# Patient Record
Sex: Male | Born: 1935 | Race: White | Hispanic: No | Marital: Married | Smoking: Never smoker
Health system: Southern US, Community
[De-identification: ages and names within clinical notes are randomized; demographics above are authoritative.]

## PROBLEM LIST (undated history)

## (undated) DIAGNOSIS — C61 Malignant neoplasm of prostate: Secondary | ICD-10-CM

## (undated) DIAGNOSIS — L729 Follicular cyst of the skin and subcutaneous tissue, unspecified: Secondary | ICD-10-CM

## (undated) DIAGNOSIS — K759 Inflammatory liver disease, unspecified: Secondary | ICD-10-CM

## (undated) DIAGNOSIS — G5603 Carpal tunnel syndrome, bilateral upper limbs: Secondary | ICD-10-CM

## (undated) DIAGNOSIS — K219 Gastro-esophageal reflux disease without esophagitis: Secondary | ICD-10-CM

## (undated) HISTORY — PX: PROSTATE SURGERY: SHX751

## (undated) HISTORY — PX: CYST REMOVAL LEG: SHX6280

## (undated) HISTORY — DX: Malignant neoplasm of prostate: C61

---
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
## 2008-05-07 ENCOUNTER — Ambulatory Visit: Payer: Self-pay | Admitting: Diagnostic Radiology

## 2008-05-07 ENCOUNTER — Ambulatory Visit (HOSPITAL_BASED_OUTPATIENT_CLINIC_OR_DEPARTMENT_OTHER): Admission: RE | Admit: 2008-05-07 | Discharge: 2008-05-07 | Payer: Self-pay | Admitting: Family Medicine

## 2009-11-10 IMAGING — CR DG HIP (WITH OR WITHOUT PELVIS) 2-3V*L*
3 series · 3 of 3 positions shown · non-contrast
Comparison: None

CLINICAL DATA: Left hip pain.  No injury.

LEFT HIP - COMPLETE 2+ VIEW

[t pelvis a.p.]
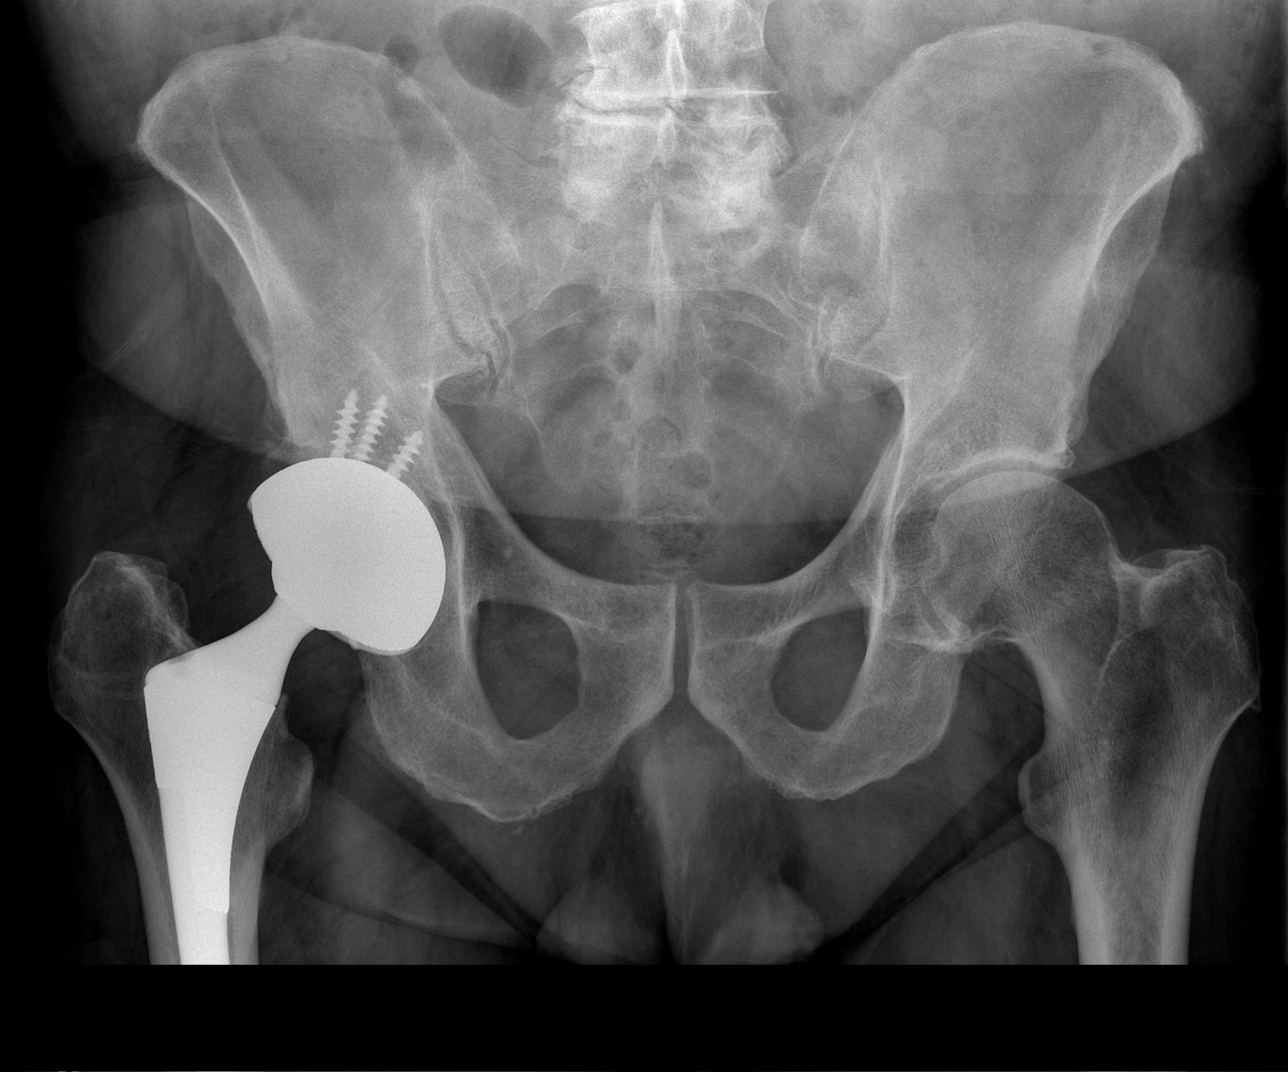

[t hip ap left]
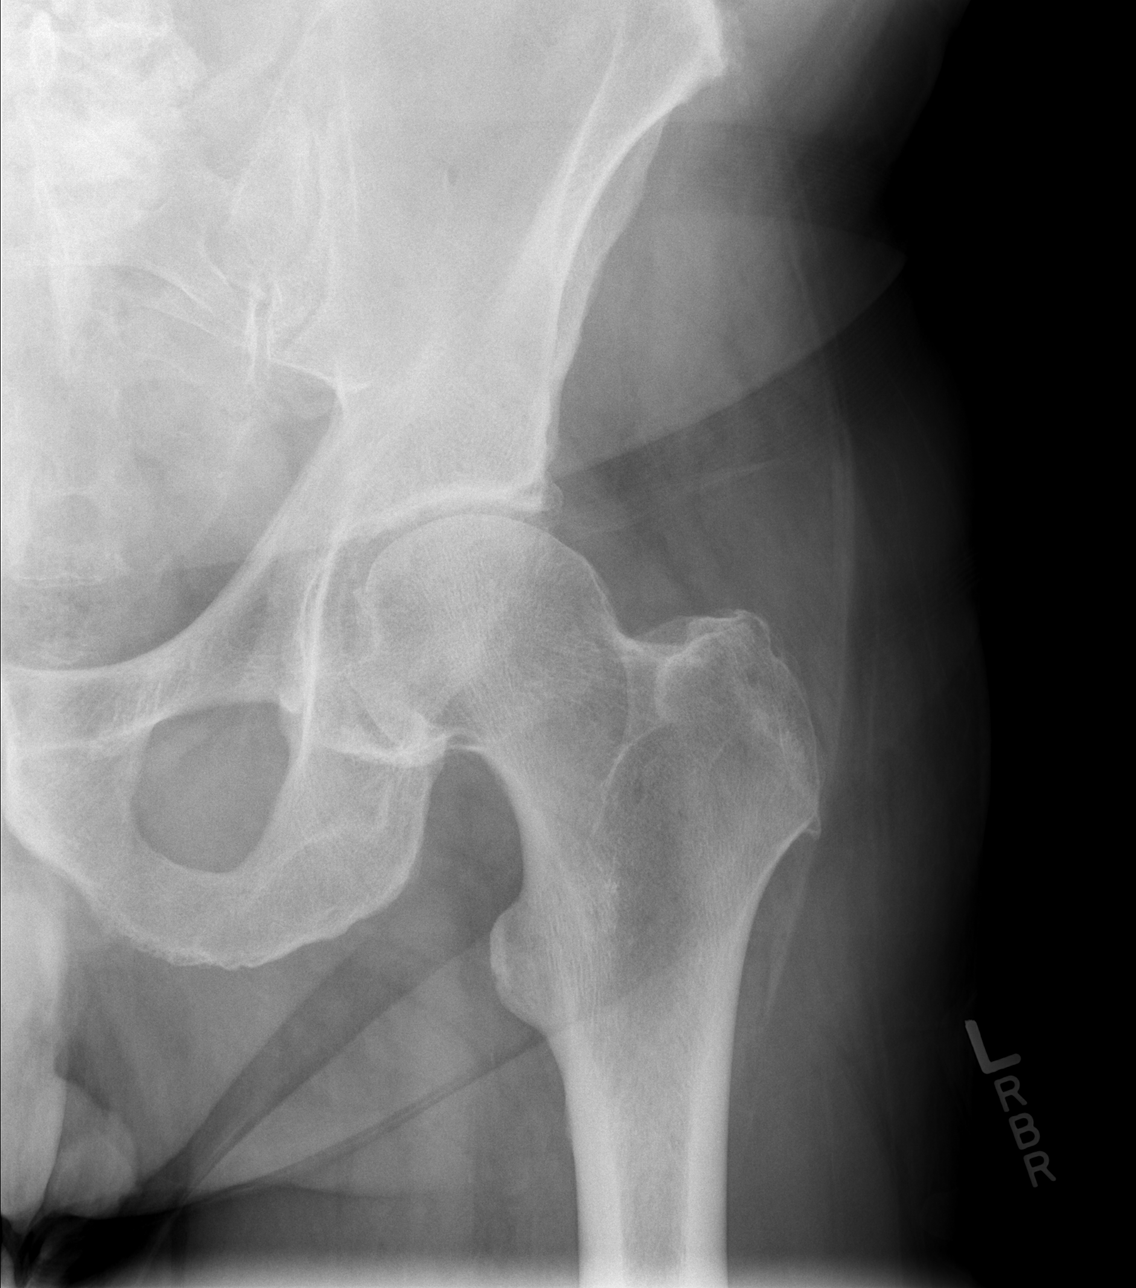

[t hip frog leg left]
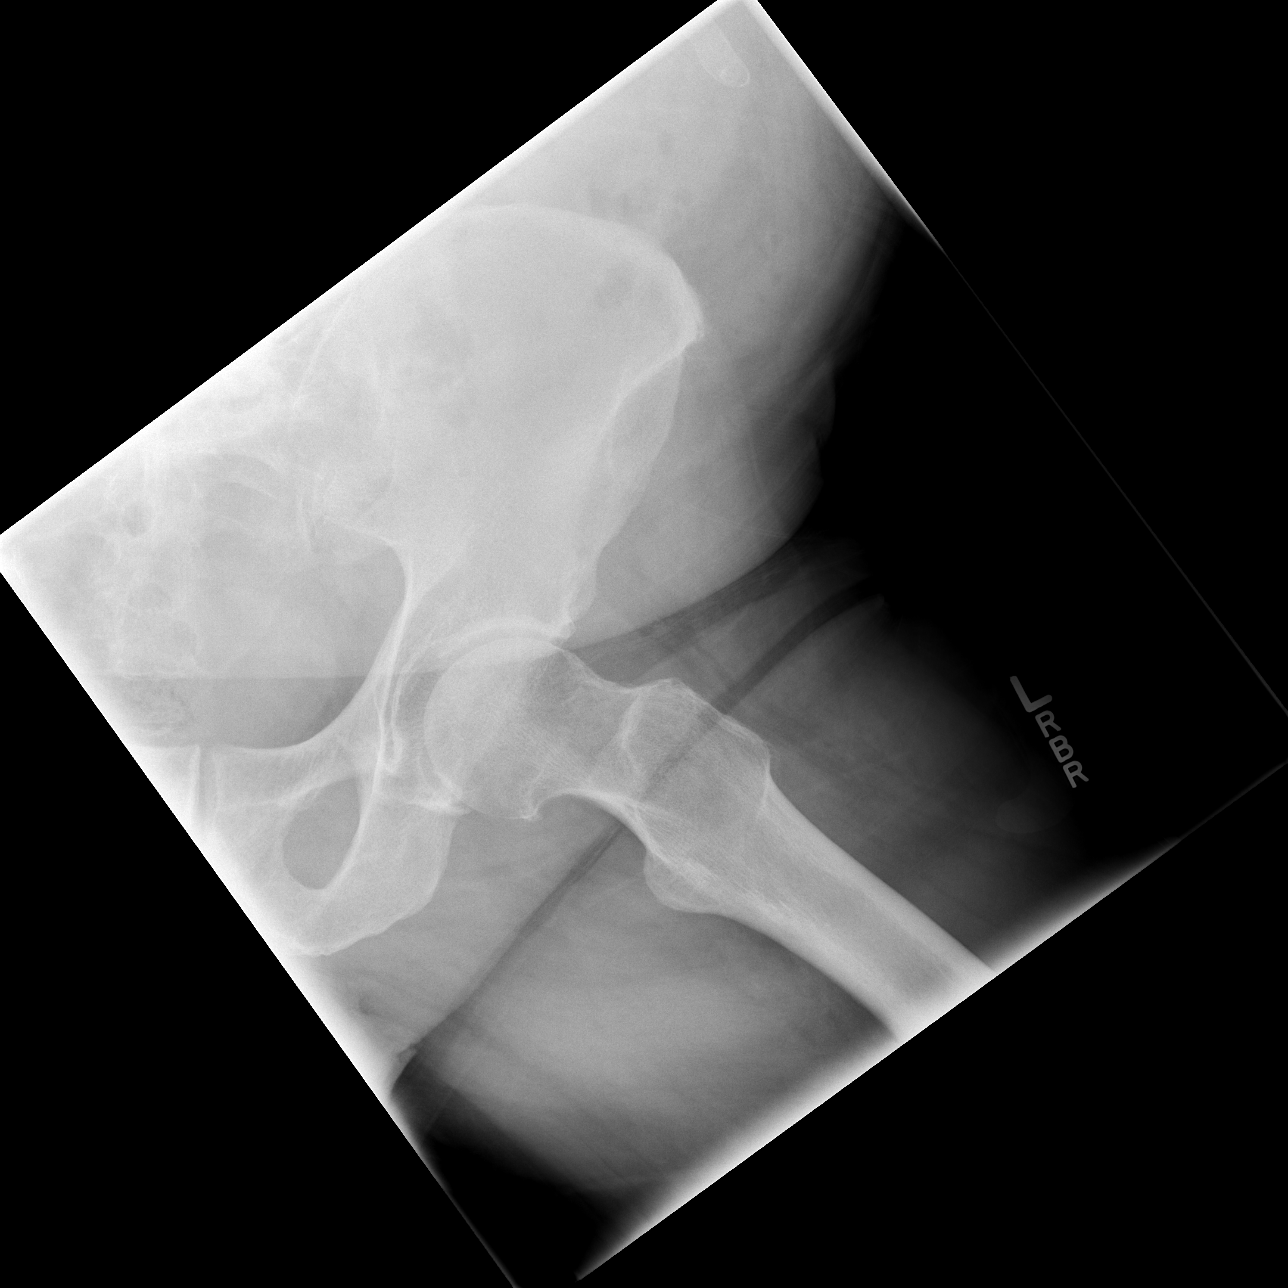

[3 of 3 positions shown; findings below may reference images not displayed]

FINDINGS: The patient status post right total hip replacement.  The
mild degenerative changes in the left hip. No acute bony
abnormality.  Specifically, no fracture, subluxation, or
dislocation.  Soft tissues are intact. SI joints are symmetric
IMPRESSION: Mild degenerative changes in the left hip.  No acute findings.

Prior right hip replacement.

## 2016-09-18 DIAGNOSIS — G5603 Carpal tunnel syndrome, bilateral upper limbs: Secondary | ICD-10-CM | POA: Diagnosis not present

## 2016-10-04 DIAGNOSIS — Z961 Presence of intraocular lens: Secondary | ICD-10-CM | POA: Diagnosis not present

## 2016-10-04 DIAGNOSIS — H524 Presbyopia: Secondary | ICD-10-CM | POA: Diagnosis not present

## 2016-10-04 DIAGNOSIS — H43393 Other vitreous opacities, bilateral: Secondary | ICD-10-CM | POA: Diagnosis not present

## 2016-10-04 DIAGNOSIS — H5203 Hypermetropia, bilateral: Secondary | ICD-10-CM | POA: Diagnosis not present

## 2016-10-04 DIAGNOSIS — H52203 Unspecified astigmatism, bilateral: Secondary | ICD-10-CM | POA: Diagnosis not present

## 2016-12-25 DIAGNOSIS — N401 Enlarged prostate with lower urinary tract symptoms: Secondary | ICD-10-CM | POA: Diagnosis not present

## 2016-12-25 DIAGNOSIS — Z0000 Encounter for general adult medical examination without abnormal findings: Secondary | ICD-10-CM | POA: Diagnosis not present

## 2016-12-25 DIAGNOSIS — K219 Gastro-esophageal reflux disease without esophagitis: Secondary | ICD-10-CM | POA: Diagnosis not present

## 2016-12-25 DIAGNOSIS — M25552 Pain in left hip: Secondary | ICD-10-CM | POA: Diagnosis not present

## 2016-12-25 DIAGNOSIS — Z131 Encounter for screening for diabetes mellitus: Secondary | ICD-10-CM | POA: Diagnosis not present

## 2016-12-25 DIAGNOSIS — R2681 Unsteadiness on feet: Secondary | ICD-10-CM | POA: Diagnosis not present

## 2016-12-25 DIAGNOSIS — M1612 Unilateral primary osteoarthritis, left hip: Secondary | ICD-10-CM | POA: Diagnosis not present

## 2016-12-25 DIAGNOSIS — R94120 Abnormal auditory function study: Secondary | ICD-10-CM | POA: Diagnosis not present

## 2016-12-26 DIAGNOSIS — Z131 Encounter for screening for diabetes mellitus: Secondary | ICD-10-CM | POA: Diagnosis not present

## 2016-12-26 DIAGNOSIS — K219 Gastro-esophageal reflux disease without esophagitis: Secondary | ICD-10-CM | POA: Diagnosis not present

## 2017-01-10 DIAGNOSIS — M1612 Unilateral primary osteoarthritis, left hip: Secondary | ICD-10-CM | POA: Diagnosis not present

## 2017-01-10 DIAGNOSIS — Z96641 Presence of right artificial hip joint: Secondary | ICD-10-CM | POA: Diagnosis not present

## 2017-01-10 DIAGNOSIS — G8929 Other chronic pain: Secondary | ICD-10-CM | POA: Diagnosis not present

## 2017-04-23 ENCOUNTER — Other Ambulatory Visit: Payer: Self-pay

## 2017-04-23 NOTE — Patient Outreach (Signed)
[place] [facility]) Care Management  04/23/2017  Turki Tapanes 1921/04/03 [id number]   [employer] High risk screen- No answer. RNCM attempted contact number provided [phone] and the number listed in chart [phone]. Both numbers are not in service. RNCM called [phone]. No answer. HIPPA compliant message left.  Thea Silversmith, RN, MSN, [place] Coordinator Cell: [phone]

## 2017-04-23 NOTE — Patient Outreach (Signed)
[facility]) Care Management  04/22/2017  Andre Clark 05-25-1920 [id number]   [employer] High risk screen- RNCM dialed provided contact number [phone]-recorded message that phone was no longer in service. Unable to leave message.  Thea Silversmith, RN, MSN, [place] Coordinator Cell: [phone]

## 2017-04-29 ENCOUNTER — Other Ambulatory Visit: Payer: Self-pay

## 2017-04-29 NOTE — Patient Outreach (Signed)
[facility]) Care Management  04/29/2017  Andre Clark [birth year] [id number]    [employer] High risk screen completed.   83 year old with history of right hip surgery about 10 years ago. He denies any history of diabetes, heart failure, COPD, hypertension or any other cardiac issue.  Andre Clark reports he and his wife just moved to an independent living facility ([facility]) in [place] within the past week.   He reports he is on one medication (omeprazole) and vitamins. He denies any difficulty with obtaining or taking medications.  Andre Clark reports his primary care is Dr. Jefm Petty at [place] (part of [facility]). He states he is interested in finding another primary care closer to his current location due to the distance. RNCM discussed the concierge (number on the back of the care) may be able to help assist with providers in the area.  Andre Clark does acknowledge some discomfort of his right knee and states his legs will sometimes give way. He now uses a Electronics engineer and states this has helped steady him. Client states primary care if aware of his knee discomfort. Client states it is not bad enough for him to take any medication even over the counter medication.  He reports a fall about 4 days ago. "I was putting some heavy boxes on seat of the walker, the braken system loosend and I tried to follow it. The box dropped on the cement and I scratched my forehead. I don't like to run to the doctor". He states he cleaned up the scratch and adds "there are no signs of infection"   RNCM recommended follow up with primary care provider). Client declines at this time, stating he has taken care of everything. Also discussed outpatient rehabilitation for physical therapy due to his report of leg sometimes giving away, knee discomfort and history of recent fall. Andre Clark is agreeable to some outpatient physical therapy. He states he has  transportation and still drives.  RNCM informed client that primary care would be the one to make the referral to the outpatient rehabilitation.  Plan: RNCM will refer to telephonic care coordinator to assist client with care coordination of resources and provider resources.    Thea Silversmith, RN, MSN, [place] Coordinator Cell: [phone]

## 2017-05-03 ENCOUNTER — Other Ambulatory Visit: Payer: Self-pay

## 2017-05-03 NOTE — Patient Outreach (Signed)
[facility]) Care Management  05/03/2017  Andre Clark 10-13-1920 [id number]  Telephone assessment follow up: Telephone call to patient. Unable to reach. HIPAA compliant voice message left with call back phone number.   PLAN; RNCM will attempt 2nd telephone outreach to patient within 5 business days.   Quinn Plowman RN,BSN,CCM [facility] Telephonic  [phone]

## 2017-05-06 ENCOUNTER — Other Ambulatory Visit: Payer: Self-pay

## 2017-05-06 NOTE — Patient Outreach (Addendum)
[facility]) Care Management  05/06/2017  Andre Clark 01-11-1921 [id number]  Attempt #2 Telephone assessment follow up: Telephone call to patient. Unable to reach. HIPAA compliant voice message left with call back phone number.   PLAN; RNCM will attempt 3rd telephone outreach to patient within 10 business days.  RNCM will send outreach letter attempt contact with patient.   Quinn Plowman RN,BSN,CCM [facility] Telephonic  [phone]

## 2017-05-08 ENCOUNTER — Ambulatory Visit: Payer: Self-pay

## 2017-05-20 ENCOUNTER — Other Ambulatory Visit: Payer: Self-pay

## 2017-05-20 NOTE — Patient Outreach (Signed)
[facility]) Care Management  05/20/2017  Andre Clark Jun 10, 1920 [id number]  Telephone assessment follow up :  Attempt #3 Telephone assessment follow up: Telephone call to patient. Unable to reach. HIPAA compliant voice message left with call back phone number.   PLAN; RNCM will send outreach letter attempt contact with patient.   Quinn Plowman RN,BSN,CCM [facility] Telephonic  [phone]

## 2017-06-04 ENCOUNTER — Other Ambulatory Visit: Payer: Self-pay

## 2017-06-04 NOTE — Patient Outreach (Signed)
[facility]) Care Management  06/04/2017  Andre Clark 05-Sep-1920 [id number]   No response from patient after 3 telephone calls and outreach letter attempt.  PLAN; RNCM will refer patient to care management assistant to close patient due to being unable to reach.  RNCM will send patients primary MD notification of closure.   Quinn Plowman RN,BSN,CCM [facility] Telephonic  [phone]

## 2017-06-06 DIAGNOSIS — Z1331 Encounter for screening for depression: Secondary | ICD-10-CM | POA: Diagnosis not present

## 2017-06-06 DIAGNOSIS — Z6830 Body mass index (BMI) 30.0-30.9, adult: Secondary | ICD-10-CM | POA: Diagnosis not present

## 2017-06-06 DIAGNOSIS — K219 Gastro-esophageal reflux disease without esophagitis: Secondary | ICD-10-CM | POA: Diagnosis not present

## 2017-06-06 DIAGNOSIS — R200 Anesthesia of skin: Secondary | ICD-10-CM | POA: Diagnosis not present

## 2017-06-06 DIAGNOSIS — R202 Paresthesia of skin: Secondary | ICD-10-CM | POA: Diagnosis not present

## 2017-06-06 DIAGNOSIS — Z79899 Other long term (current) drug therapy: Secondary | ICD-10-CM | POA: Diagnosis not present

## 2017-06-06 DIAGNOSIS — Z9181 History of falling: Secondary | ICD-10-CM | POA: Diagnosis not present

## 2017-06-06 DIAGNOSIS — Z1339 Encounter for screening examination for other mental health and behavioral disorders: Secondary | ICD-10-CM | POA: Diagnosis not present

## 2017-08-01 DIAGNOSIS — J189 Pneumonia, unspecified organism: Secondary | ICD-10-CM | POA: Diagnosis not present

## 2017-08-01 DIAGNOSIS — R509 Fever, unspecified: Secondary | ICD-10-CM | POA: Diagnosis not present

## 2017-08-26 DIAGNOSIS — C44212 Basal cell carcinoma of skin of right ear and external auricular canal: Secondary | ICD-10-CM | POA: Diagnosis not present

## 2017-09-16 DIAGNOSIS — C44222 Squamous cell carcinoma of skin of right ear and external auricular canal: Secondary | ICD-10-CM | POA: Diagnosis not present

## 2017-09-16 DIAGNOSIS — L570 Actinic keratosis: Secondary | ICD-10-CM | POA: Diagnosis not present

## 2017-09-17 DIAGNOSIS — K045 Chronic apical periodontitis: Secondary | ICD-10-CM | POA: Diagnosis not present

## 2017-10-23 DIAGNOSIS — H524 Presbyopia: Secondary | ICD-10-CM | POA: Diagnosis not present

## 2017-10-23 DIAGNOSIS — H5203 Hypermetropia, bilateral: Secondary | ICD-10-CM | POA: Diagnosis not present

## 2017-10-23 DIAGNOSIS — H52203 Unspecified astigmatism, bilateral: Secondary | ICD-10-CM | POA: Diagnosis not present

## 2017-10-23 DIAGNOSIS — H43393 Other vitreous opacities, bilateral: Secondary | ICD-10-CM | POA: Diagnosis not present

## 2017-11-18 DIAGNOSIS — Z1339 Encounter for screening examination for other mental health and behavioral disorders: Secondary | ICD-10-CM | POA: Diagnosis not present

## 2017-11-18 DIAGNOSIS — Z6830 Body mass index (BMI) 30.0-30.9, adult: Secondary | ICD-10-CM | POA: Diagnosis not present

## 2017-11-18 DIAGNOSIS — M25552 Pain in left hip: Secondary | ICD-10-CM | POA: Diagnosis not present

## 2017-11-29 DIAGNOSIS — M1612 Unilateral primary osteoarthritis, left hip: Secondary | ICD-10-CM | POA: Diagnosis not present

## 2017-11-29 DIAGNOSIS — M25552 Pain in left hip: Secondary | ICD-10-CM | POA: Diagnosis not present

## 2017-12-04 DIAGNOSIS — R202 Paresthesia of skin: Secondary | ICD-10-CM | POA: Diagnosis not present

## 2017-12-04 DIAGNOSIS — Z6831 Body mass index (BMI) 31.0-31.9, adult: Secondary | ICD-10-CM | POA: Diagnosis not present

## 2017-12-04 DIAGNOSIS — R2681 Unsteadiness on feet: Secondary | ICD-10-CM | POA: Diagnosis not present

## 2017-12-04 DIAGNOSIS — R200 Anesthesia of skin: Secondary | ICD-10-CM | POA: Diagnosis not present

## 2017-12-04 DIAGNOSIS — M25552 Pain in left hip: Secondary | ICD-10-CM | POA: Diagnosis not present

## 2017-12-17 DIAGNOSIS — R2681 Unsteadiness on feet: Secondary | ICD-10-CM | POA: Diagnosis not present

## 2017-12-19 DIAGNOSIS — R2681 Unsteadiness on feet: Secondary | ICD-10-CM | POA: Diagnosis not present

## 2018-03-05 DIAGNOSIS — Z96642 Presence of left artificial hip joint: Secondary | ICD-10-CM | POA: Diagnosis not present

## 2018-03-05 DIAGNOSIS — Z96641 Presence of right artificial hip joint: Secondary | ICD-10-CM | POA: Diagnosis not present

## 2018-03-05 DIAGNOSIS — M47816 Spondylosis without myelopathy or radiculopathy, lumbar region: Secondary | ICD-10-CM | POA: Diagnosis not present

## 2018-03-05 DIAGNOSIS — M533 Sacrococcygeal disorders, not elsewhere classified: Secondary | ICD-10-CM | POA: Diagnosis not present

## 2018-03-05 DIAGNOSIS — M1612 Unilateral primary osteoarthritis, left hip: Secondary | ICD-10-CM | POA: Diagnosis not present

## 2018-03-05 DIAGNOSIS — Z471 Aftercare following joint replacement surgery: Secondary | ICD-10-CM | POA: Diagnosis not present

## 2018-06-10 DIAGNOSIS — I7300 Raynaud's syndrome without gangrene: Secondary | ICD-10-CM | POA: Diagnosis not present

## 2018-06-10 DIAGNOSIS — G5603 Carpal tunnel syndrome, bilateral upper limbs: Secondary | ICD-10-CM | POA: Diagnosis not present

## 2018-06-10 DIAGNOSIS — M25552 Pain in left hip: Secondary | ICD-10-CM | POA: Diagnosis not present

## 2018-06-10 DIAGNOSIS — Z6830 Body mass index (BMI) 30.0-30.9, adult: Secondary | ICD-10-CM | POA: Diagnosis not present

## 2018-07-30 ENCOUNTER — Encounter: Payer: Self-pay | Admitting: Neurology

## 2018-07-30 ENCOUNTER — Ambulatory Visit: Payer: PPO | Admitting: Neurology

## 2018-07-30 ENCOUNTER — Other Ambulatory Visit: Payer: Self-pay

## 2018-07-30 VITALS — BP 132/71 | HR 79 | Ht 64.0 in | Wt 181.3 lb

## 2018-07-30 DIAGNOSIS — G5603 Carpal tunnel syndrome, bilateral upper limbs: Secondary | ICD-10-CM | POA: Diagnosis not present

## 2018-07-30 NOTE — Progress Notes (Signed)
Reason for visit: Bilateral hand numbness  Referring physician: [name] is a 85 y.o. male  History of present illness:  Andre Clark is a 85 year old right-handed white male with a history of numbness in the hands that he has noted about 1 year.  He is having increasing difficulty manipulating things with his hands, he needs assistance buttoning buttons and managing zippers.  He is dropping things from his hands.  He does occasionally have shock sensations into the fingers.  He denies neck pain or pain down the arms.  He denies any numbness in the feet or any change in balance.  He does have some gait instability, he has a severely degenerated left hip and he has a lot of pain with walking.  He reports no difficulty controlling the bowels or the bladder.  He is sent to this office for further evaluation.  Past Medical History:  Diagnosis Date  . Prostate cancer [facility])     Past Surgical History:  Procedure Laterality Date  . CYST REMOVAL LEG Right   . PROSTATE SURGERY      Family History  Problem Relation Age of Onset  . Lung cancer Father     Social history:  reports that he has never smoked. He has never used smokeless tobacco. He reports that he does not drink alcohol or use drugs.  Medications:  Prior to Admission medications   Medication Sig Start Date End Date Taking? Authorizing Provider  omeprazole (PRILOSEC) 40 MG capsule omeprazole 40 mg capsule,delayed release 12/25/16  Yes [provider]  meloxicam (MOBIC) 7.5 MG tablet TK 1 T PO EVERY OTHER DAY WF 06/10/18   [provider]     No Known Allergies  ROS:  Out of a complete 14 system review of symptoms, the patient complains only of the following symptoms, and all other reviewed systems are negative.  Numbness Impotence  Blood pressure 132/71, pulse 79, height 5\' 4"  (1.626 m), weight 181 lb 5 oz (82.2 kg).  Physical Exam  General: The patient is alert and cooperative  at the time of the examination.  Eyes: Pupils are equal, round, and reactive to light. Discs are flat bilaterally.  Neck: The neck is supple, no carotid bruits are noted.  Respiratory: The respiratory examination is clear.  Cardiovascular: The cardiovascular examination reveals a regular rate and rhythm, no obvious murmurs or rubs are noted.  Skin: Extremities are without significant edema.  Neurologic Exam  Mental status: The patient is alert and oriented x 3 at the time of the examination. The patient has apparent normal recent and remote memory, with an apparently normal attention span and concentration ability.  Cranial nerves: Facial symmetry is not present.  Slight ptosis of the right eye is seen.  There is good sensation of the face to pinprick and soft touch bilaterally. The strength of the facial muscles and the muscles to head turning and shoulder shrug are normal bilaterally. Speech is well enunciated, no aphasia or dysarthria is noted. Extraocular movements are full. Visual fields are full. The tongue is midline, and the patient has symmetric elevation of the soft palate. No obvious hearing deficits are noted.  Motor: The motor testing reveals 5 over 5 strength of all 4 extremities, with exception of weakness of the APB muscles bilaterally, significant wasting of these muscles is seen. Good symmetric motor tone is noted throughout.  Sensory: Sensory testing is intact to pinprick, soft touch, vibration sensation, and position sense on all  4 extremities, with exception some decreased position sense in the right great toe. No evidence of extinction is noted.  Coordination: Cerebellar testing reveals good finger-nose-finger and heel-to-shin bilaterally.  Tinel sign at the wrists are positive bilaterally.  Gait and station: Gait is associated with a wide-based, limping gait on the left leg.  The patient normally uses a cane for ambulation.  Tandem gait was not attempted.  Romberg is  negative.  Reflexes: Deep tendon reflexes are symmetric, but are depressed bilaterally. Toes are downgoing bilaterally.   Assessment/Plan:  1.  Severe bilateral carpal tunnel syndrome  The patient appears to have wasting of the APB muscles bilaterally, we will give a prescription for wrist splints bilaterally for the next couple months, we will do nerve conduction study and EMG evaluation to determine if the problem is end-stage.  We may consider a referral to a hand surgeon primarily to see if injections in the nerve may be of some benefit.  The patient mainly wishes to regain some function of his hands, he is now requiring assistance doing activities of daily living because he cannot use his hands effectively.  He is 85 years old but he is otherwise in excellent health.  Jill Alexanders MD 07/30/2018 9:44 AM  [facility] [address] [place], [place] [address]  Phone [phone] Fax [phone]

## 2018-08-12 ENCOUNTER — Encounter: Payer: PPO | Admitting: Neurology

## 2018-09-09 DIAGNOSIS — K219 Gastro-esophageal reflux disease without esophagitis: Secondary | ICD-10-CM | POA: Diagnosis not present

## 2018-09-09 DIAGNOSIS — Z6830 Body mass index (BMI) 30.0-30.9, adult: Secondary | ICD-10-CM | POA: Diagnosis not present

## 2018-09-09 DIAGNOSIS — Z0000 Encounter for general adult medical examination without abnormal findings: Secondary | ICD-10-CM | POA: Diagnosis not present

## 2018-09-09 DIAGNOSIS — Z9181 History of falling: Secondary | ICD-10-CM | POA: Diagnosis not present

## 2018-09-09 DIAGNOSIS — M25552 Pain in left hip: Secondary | ICD-10-CM | POA: Diagnosis not present

## 2018-09-09 DIAGNOSIS — Z1331 Encounter for screening for depression: Secondary | ICD-10-CM | POA: Diagnosis not present

## 2018-09-10 DIAGNOSIS — K219 Gastro-esophageal reflux disease without esophagitis: Secondary | ICD-10-CM | POA: Diagnosis not present

## 2018-09-10 DIAGNOSIS — Z79899 Other long term (current) drug therapy: Secondary | ICD-10-CM | POA: Diagnosis not present

## 2018-10-27 DIAGNOSIS — H43393 Other vitreous opacities, bilateral: Secondary | ICD-10-CM | POA: Diagnosis not present

## 2018-10-27 DIAGNOSIS — Z961 Presence of intraocular lens: Secondary | ICD-10-CM | POA: Diagnosis not present

## 2018-10-27 DIAGNOSIS — H52203 Unspecified astigmatism, bilateral: Secondary | ICD-10-CM | POA: Diagnosis not present

## 2018-10-27 DIAGNOSIS — H5203 Hypermetropia, bilateral: Secondary | ICD-10-CM | POA: Diagnosis not present

## 2018-10-27 DIAGNOSIS — H04123 Dry eye syndrome of bilateral lacrimal glands: Secondary | ICD-10-CM | POA: Diagnosis not present

## 2018-10-27 DIAGNOSIS — H43813 Vitreous degeneration, bilateral: Secondary | ICD-10-CM | POA: Diagnosis not present

## 2018-10-27 DIAGNOSIS — H524 Presbyopia: Secondary | ICD-10-CM | POA: Diagnosis not present

## 2018-11-05 ENCOUNTER — Telehealth: Payer: Self-pay

## 2018-11-05 NOTE — Telephone Encounter (Signed)
I called and LVM on patients wife cell making her aware that we could go ahead and reschedule his NCV/EMG with Dr. Jannifer Franklin if he was ready. I advised her to contact the office at her convenience.

## 2018-12-31 DIAGNOSIS — M1612 Unilateral primary osteoarthritis, left hip: Secondary | ICD-10-CM | POA: Diagnosis not present

## 2019-03-12 ENCOUNTER — Other Ambulatory Visit: Payer: Self-pay | Admitting: Orthopedic Surgery

## 2019-03-12 DIAGNOSIS — M25552 Pain in left hip: Secondary | ICD-10-CM | POA: Diagnosis not present

## 2019-03-12 DIAGNOSIS — M1612 Unilateral primary osteoarthritis, left hip: Secondary | ICD-10-CM | POA: Diagnosis not present

## 2019-03-17 NOTE — Patient Instructions (Addendum)
DUE TO COVID-19 ONLY ONE VISITOR IS ALLOWED TO COME WITH YOU AND STAY IN THE WAITING ROOM ONLY DURING PRE OP AND PROCEDURE. THE ONE VISITOR MAY VISIT WITH YOU IN YOUR PRIVATE ROOM DURING VISITING HOURS ONLY!!   COVID SWAB TESTING MUST BE COMPLETED ON:  Today Immediately after pre op appointment   [address], [facility] (Must self quarantine after testing. Follow instructions on handout.)             Your procedure is scheduled on: Friday, Oct. 30, 2020   Report to [facility] Main  Entrance    Report to admitting at 7:05 AM   Call this number if you have problems the morning of surgery [phone]   Do not eat food :After Midnight.   May have liquids until 7:00AM day of surgery   CLEAR LIQUID DIET  Foods Allowed                                                                     Foods Excluded  Water, Black Coffee and tea, regular and decaf                             liquids that you cannot  Plain Jell-O in any flavor  (No red)                                           see through such as: Fruit ices (not with fruit pulp)                                     milk, soups, orange juice  Iced Popsicles (No red)                                    All solid food Carbonated beverages, regular and diet                                    Apple juices Sports drinks like Gatorade (No red) Lightly seasoned clear broth or consume(fat free) Sugar, honey syrup  Sample Menu Breakfast                                Lunch                                     Supper Cranberry juice                    Beef broth                            Chicken broth Jell-O  Grape juice                           Apple juice Coffee or tea                        Jell-O                                      Popsicle                                                Coffee or tea                         Coffee or tea   Complete one Ensure drink the morning of surgery at 7:00AM the day of surgery.   Brush your teeth the morning of surgery.   Do NOT smoke after Midnight   Take these medicines the morning of surgery with A SIP OF WATER: Omeprazole                               You may not have any metal on your body including jewelry, and body piercings             Do not wear lotions, powders, perfumes/cologne, or deodorant                           Men may shave face and neck.   Do not bring valuables to the hospital. [place].   Contacts, dentures or bridgework may not be worn into surgery.   Bring small overnight bag day of surgery.    Special Instructions: Bring a copy of your healthcare power of attorney and living will documents         the day of surgery if you haven't scanned them in before.              Please read over the following fact sheets you were given:  [facility] - Preparing for Surgery Before surgery, you can play an important role.  Because skin is not sterile, your skin needs to be as free of germs as possible.  You can reduce the number of germs on your skin by washing with CHG (chlorahexidine gluconate) soap before surgery.  CHG is an antiseptic cleaner which kills germs and bonds with the skin to continue killing germs even after washing. Please DO NOT use if you have an allergy to CHG or antibacterial soaps.  If your skin becomes reddened/irritated stop using the CHG and inform your nurse when you arrive at Short Stay. Do not shave (including legs and underarms) for at least 48 hours prior to the first CHG shower.  You may shave your face/neck.  Please follow these instructions carefully:  1.  Shower with CHG Soap the night before surgery and the  morning of surgery.  2.  If you choose to wash your hair, wash your hair first as usual with your normal  shampoo.  3.  After you shampoo, rinse your hair and body  thoroughly to remove the shampoo.                             4.  Use CHG as you would any other liquid soap.  You can apply chg directly to the skin and wash.  Gently with a scrungie or clean washcloth.  5.  Apply the CHG Soap to your body ONLY FROM THE NECK DOWN.   Do   not use on face/ open                           Wound or open sores. Avoid contact with eyes, ears mouth and   genitals (private parts).                       Wash face,  Genitals (private parts) with your normal soap.             6.  Wash thoroughly, paying special attention to the area where your    surgery  will be performed.  7.  Thoroughly rinse your body with warm water from the neck down.  8.  DO NOT shower/wash with your normal soap after using and rinsing off the CHG Soap.                9.  Pat yourself dry with a clean towel.            10.  Wear clean pajamas.            11.  Place clean sheets on your bed the night of your first shower and do not  sleep with pets. Day of Surgery : Do not apply any lotions/deodorants the morning of surgery.  Please wear clean clothes to the hospital/surgery center.  FAILURE TO FOLLOW THESE INSTRUCTIONS MAY RESULT IN THE CANCELLATION OF YOUR SURGERY  PATIENT SIGNATURE_________________________________  NURSE SIGNATURE__________________________________  ________________________________________________________________________  Adam Phenix  An incentive spirometer is a tool that can help keep your lungs clear and active. This tool measures how well you are filling your lungs with each breath. Taking long deep breaths may help reverse or decrease the chance of developing breathing (pulmonary) problems (especially infection) following:  A long period of time when you are unable to move or be active. BEFORE THE PROCEDURE   If the spirometer includes an indicator to show your best effort, your nurse or respiratory therapist will set it to a desired goal.  If possible, sit  up straight or lean slightly forward. Try not to slouch.  Hold the incentive spirometer in an upright position. INSTRUCTIONS FOR USE  1. Sit on the edge of your bed if possible, or sit up as far as you can in bed or on a chair. 2. Hold the incentive spirometer in an upright position. 3. Breathe out normally. 4. Place the mouthpiece in your mouth and seal your lips tightly around it. 5. Breathe in slowly and as deeply as possible, raising the piston or the ball toward the top of the column. 6. Hold your breath for 3-5 seconds or for as long as possible. Allow the piston or ball to fall to the bottom of the column. 7. Remove the mouthpiece from your mouth and breathe out normally. 8. Rest for a few seconds and repeat Steps 1 through 7 at least  10 times every 1-2 hours when you are awake. Take your time and take a few normal breaths between deep breaths. 9. The spirometer may include an indicator to show your best effort. Use the indicator as a goal to work toward during each repetition. 10. After each set of 10 deep breaths, practice coughing to be sure your lungs are clear. If you have an incision (the cut made at the time of surgery), support your incision when coughing by placing a pillow or rolled up towels firmly against it. Once you are able to get out of bed, walk around indoors and cough well. You may stop using the incentive spirometer when instructed by your caregiver.  RISKS AND COMPLICATIONS  Take your time so you do not get dizzy or light-headed.  If you are in pain, you may need to take or ask for pain medication before doing incentive spirometry. It is harder to take a deep breath if you are having pain. AFTER USE  Rest and breathe slowly and easily.  It can be helpful to keep track of a log of your progress. Your caregiver can provide you with a simple table to help with this. If you are using the spirometer at home, follow these instructions: [place] IF:   You are  having difficultly using the spirometer.  You have trouble using the spirometer as often as instructed.  Your pain medication is not giving enough relief while using the spirometer.  You develop fever of 100.5 F (38.1 C) or higher. SEEK IMMEDIATE MEDICAL CARE IF:   You cough up bloody sputum that had not been present before.  You develop fever of 102 F (38.9 C) or greater.  You develop worsening pain at or near the incision site. MAKE SURE YOU:   Understand these instructions.  Will watch your condition.  Will get help right away if you are not doing well or get worse. Document Released: 09/17/2006 Document Revised: 07/30/2011 Document Reviewed: 11/18/2006 ExitCare Patient Information 2014 ExitCare, [place].   ________________________________________________________________________  WHAT IS A BLOOD TRANSFUSION? Blood Transfusion Information  A transfusion is the replacement of blood or some of its parts. Blood is made up of multiple cells which provide different functions.  Red blood cells carry oxygen and are used for blood loss replacement.  White blood cells fight against infection.  Platelets control bleeding.  Plasma helps clot blood.  Other blood products are available for specialized needs, such as hemophilia or other clotting disorders. BEFORE THE TRANSFUSION  Who gives blood for transfusions?   Healthy volunteers who are fully evaluated to make sure their blood is safe. This is blood bank blood. Transfusion therapy is the safest it has ever been in the practice of medicine. Before blood is taken from a donor, a complete history is taken to make sure that person has no history of diseases nor engages in risky social behavior (examples are intravenous drug use or sexual activity with multiple partners). The donor's travel history is screened to minimize risk of transmitting infections, such as malaria. The donated blood is tested for signs of infectious diseases,  such as HIV and hepatitis. The blood is then tested to be sure it is compatible with you in order to minimize the chance of a transfusion reaction. If you or a relative donates blood, this is often done in anticipation of surgery and is not appropriate for emergency situations. It takes many days to process the donated blood. RISKS AND COMPLICATIONS Although transfusion therapy  is very safe and saves many lives, the main dangers of transfusion include:   Getting an infectious disease.  Developing a transfusion reaction. This is an allergic reaction to something in the blood you were given. Every precaution is taken to prevent this. The decision to have a blood transfusion has been considered carefully by your caregiver before blood is given. Blood is not given unless the benefits outweigh the risks. AFTER THE TRANSFUSION  Right after receiving a blood transfusion, you will usually feel much better and more energetic. This is especially true if your red blood cells have gotten low (anemic). The transfusion raises the level of the red blood cells which carry oxygen, and this usually causes an energy increase.  The nurse administering the transfusion will monitor you carefully for complications. HOME CARE INSTRUCTIONS  No special instructions are needed after a transfusion. You may find your energy is better. Speak with your caregiver about any limitations on activity for underlying diseases you may have. SEEK MEDICAL CARE IF:   Your condition is not improving after your transfusion.  You develop redness or irritation at the intravenous (IV) site. SEEK IMMEDIATE MEDICAL CARE IF:  Any of the following symptoms occur over the next 12 hours:  Shaking chills.  You have a temperature by mouth above 102 F (38.9 C), not controlled by medicine.  Chest, back, or muscle pain.  People around you feel you are not acting correctly or are confused.  Shortness of breath or difficulty  breathing.  Dizziness and fainting.  You get a rash or develop hives.  You have a decrease in urine output.  Your urine turns a dark color or changes to pink, red, or brown. Any of the following symptoms occur over the next 10 days:  You have a temperature by mouth above 102 F (38.9 C), not controlled by medicine.  Shortness of breath.  Weakness after normal activity.  The white part of the eye turns yellow (jaundice).  You have a decrease in the amount of urine or are urinating less often.  Your urine turns a dark color or changes to pink, red, or brown. Document Released: 05/04/2000 Document Revised: 07/30/2011 Document Reviewed: 12/22/2007 [facility] Patient Information 2014 [place], [place].  _______________________________________________________________________

## 2019-03-18 ENCOUNTER — Other Ambulatory Visit: Payer: Self-pay

## 2019-03-18 ENCOUNTER — Encounter (HOSPITAL_COMMUNITY)
Admission: RE | Admit: 2019-03-18 | Discharge: 2019-03-18 | Disposition: A | Discharge status: Home / Self Care | Payer: PPO | Source: Ambulatory Visit | Attending: Orthopedic Surgery | Admitting: Orthopedic Surgery

## 2019-03-18 ENCOUNTER — Other Ambulatory Visit (HOSPITAL_COMMUNITY)
Admission: RE | Admit: 2019-03-18 | Discharge: 2019-03-18 | Disposition: A | Discharge status: Home / Self Care | Payer: PPO | Source: Ambulatory Visit | Attending: Orthopedic Surgery | Admitting: Orthopedic Surgery

## 2019-03-18 ENCOUNTER — Encounter (HOSPITAL_COMMUNITY): Payer: Self-pay

## 2019-03-18 DIAGNOSIS — Z20828 Contact with and (suspected) exposure to other viral communicable diseases: Secondary | ICD-10-CM | POA: Diagnosis not present

## 2019-03-18 DIAGNOSIS — Z01812 Encounter for preprocedural laboratory examination: Secondary | ICD-10-CM | POA: Insufficient documentation

## 2019-03-18 DIAGNOSIS — M1612 Unilateral primary osteoarthritis, left hip: Secondary | ICD-10-CM | POA: Diagnosis not present

## 2019-03-18 HISTORY — DX: Follicular cyst of the skin and subcutaneous tissue, unspecified: L72.9

## 2019-03-18 HISTORY — DX: Carpal tunnel syndrome, bilateral upper limbs: G56.03

## 2019-03-18 HISTORY — DX: Gastro-esophageal reflux disease without esophagitis: K21.9

## 2019-03-18 LAB — CBC
HCT: 41.3 % (ref 39.0–52.0)
Hemoglobin: 13.4 g/dL (ref 13.0–17.0)
MCH: 31.8 pg (ref 26.0–34.0)
MCHC: 32.4 g/dL (ref 30.0–36.0)
MCV: 97.9 fL (ref 80.0–100.0)
Platelets: 208 10*3/uL (ref 150–400)
RBC: 4.22 MIL/uL (ref 4.22–5.81)
RDW: 13.7 % (ref 11.5–15.5)
WBC: 6.6 10*3/uL (ref 4.0–10.5)
nRBC: 0 % (ref 0.0–0.2)

## 2019-03-18 LAB — ABO/RH: ABO/RH(D): O NEG

## 2019-03-18 LAB — SARS CORONAVIRUS 2 (TAT 6-24 HRS): SARS Coronavirus 2: NEGATIVE

## 2019-03-18 LAB — SURGICAL PCR SCREEN
MRSA, PCR: NEGATIVE
Staphylococcus aureus: NEGATIVE

## 2019-03-18 NOTE — Progress Notes (Signed)
SPOKE W/  Mitzi Hansen     SCREENING SYMPTOMS OF COVID 19:   COUGH--NO  RUNNY NOSE--- NO  SORE THROAT---NO  NASAL CONGESTION----NO  SNEEZING----NO  SHORTNESS OF BREATH---NO  DIFFICULTY BREATHING---NO  TEMP >100.0 -----NO  UNEXPLAINED BODY ACHES------NO  CHILLS -------- NO  HEADACHES ---------NO  LOSS OF SMELL/ TASTE --------NO    HAVE YOU OR ANY FAMILY MEMBER TRAVELLED PAST 14 DAYS OUT OF THE   COUNTY---Lives in [facility] STATE----NO COUNTRY----NO  HAVE YOU OR ANY FAMILY MEMBER BEEN EXPOSED TO ANYONE WITH COVID 19? NO

## 2019-03-18 NOTE — Progress Notes (Signed)
PCP - Dr. Serita Grammes Cardiologist - N/A  Chest x-ray - N/A EKG - N/A Stress Test - N/A ECHO - N/A Cardiac Cath - N/A  Sleep Study - N/A CPAP - N/A  Fasting Blood Sugar -  N/A Checks Blood Sugar __N/A___ times a day  Blood Thinner Instructions:   N/A Aspirin Instructions:N/A Last Dose:N/A  Anesthesia review: N/A  Patient denies shortness of breath, fever, cough and chest pain at PAT appointment   Patient verbalized understanding of instructions that were given to them at the PAT appointment. Patient was also instructed that they will need to review over the PAT instructions again at home before surgery.

## 2019-03-19 MED ORDER — BUPIVACAINE LIPOSOME 1.3 % IJ SUSP
20.0000 mL | Freq: Once | INTRAMUSCULAR | Status: DC
  Filled 2019-03-19: qty 20

## 2019-03-20 ENCOUNTER — Ambulatory Visit (HOSPITAL_COMMUNITY): Payer: PPO | Admitting: Certified Registered Nurse Anesthetist

## 2019-03-20 ENCOUNTER — Encounter (HOSPITAL_COMMUNITY): Payer: Self-pay | Admitting: General Practice

## 2019-03-20 ENCOUNTER — Ambulatory Visit (HOSPITAL_COMMUNITY): Payer: PPO

## 2019-03-20 ENCOUNTER — Ambulatory Visit (HOSPITAL_COMMUNITY): Payer: PPO | Admitting: Vascular Surgery

## 2019-03-20 ENCOUNTER — Observation Stay (HOSPITAL_COMMUNITY)
Admission: RE | Admit: 2019-03-20 | Discharge: 2019-03-22 | Disposition: A | Discharge status: Home / Self Care | Payer: PPO | Attending: Orthopedic Surgery | Admitting: Orthopedic Surgery

## 2019-03-20 ENCOUNTER — Encounter (HOSPITAL_COMMUNITY)
Admission: RE | Disposition: A | Discharge status: Home / Self Care | Payer: Self-pay | Source: Home / Self Care | Attending: Orthopedic Surgery

## 2019-03-20 ENCOUNTER — Other Ambulatory Visit: Payer: Self-pay

## 2019-03-20 DIAGNOSIS — Z471 Aftercare following joint replacement surgery: Secondary | ICD-10-CM | POA: Diagnosis not present

## 2019-03-20 DIAGNOSIS — M1612 Unilateral primary osteoarthritis, left hip: Principal | ICD-10-CM | POA: Diagnosis present

## 2019-03-20 DIAGNOSIS — C61 Malignant neoplasm of prostate: Secondary | ICD-10-CM | POA: Diagnosis not present

## 2019-03-20 DIAGNOSIS — G5603 Carpal tunnel syndrome, bilateral upper limbs: Secondary | ICD-10-CM | POA: Diagnosis not present

## 2019-03-20 DIAGNOSIS — Z96641 Presence of right artificial hip joint: Secondary | ICD-10-CM | POA: Diagnosis not present

## 2019-03-20 DIAGNOSIS — Z8546 Personal history of malignant neoplasm of prostate: Secondary | ICD-10-CM | POA: Diagnosis not present

## 2019-03-20 DIAGNOSIS — Z419 Encounter for procedure for purposes other than remedying health state, unspecified: Secondary | ICD-10-CM

## 2019-03-20 DIAGNOSIS — K219 Gastro-esophageal reflux disease without esophagitis: Secondary | ICD-10-CM | POA: Insufficient documentation

## 2019-03-20 DIAGNOSIS — Z96642 Presence of left artificial hip joint: Secondary | ICD-10-CM | POA: Diagnosis not present

## 2019-03-20 DIAGNOSIS — Z791 Long term (current) use of non-steroidal anti-inflammatories (NSAID): Secondary | ICD-10-CM | POA: Insufficient documentation

## 2019-03-20 HISTORY — PX: TOTAL HIP ARTHROPLASTY: SHX124

## 2019-03-20 LAB — TYPE AND SCREEN
ABO/RH(D): O NEG
Antibody Screen: NEGATIVE

## 2019-03-20 SURGERY — ARTHROPLASTY, HIP, TOTAL, ANTERIOR APPROACH
Anesthesia: Spinal | Site: Hip | Laterality: Left

## 2019-03-20 MED ORDER — POLYETHYLENE GLYCOL 3350 17 G PO PACK: 17.0000 g | PACK | Freq: Every day | ORAL | Status: DC | PRN

## 2019-03-20 MED ORDER — SODIUM CHLORIDE 0.9 % IR SOLN
Status: DC | PRN
  Administered 2019-03-20: 1000 mL

## 2019-03-20 MED ORDER — METHOCARBAMOL 500 MG IVPB - SIMPLE MED
500.0000 mg | Freq: Four times a day (QID) | INTRAVENOUS | Status: DC | PRN
  Filled 2019-03-20: qty 50

## 2019-03-20 MED ORDER — PHENYLEPHRINE HCL-NACL 10-0.9 MG/250ML-% IV SOLN
INTRAVENOUS | Status: DC | PRN
  Administered 2019-03-20: 35 ug/min via INTRAVENOUS

## 2019-03-20 MED ORDER — ACETAMINOPHEN 325 MG PO TABS
325.0000 mg | ORAL_TABLET | Freq: Four times a day (QID) | ORAL | Status: DC | PRN
  Administered 2019-03-21 – 2019-03-22 (×3): 650 mg via ORAL
  Filled 2019-03-20 (×3): qty 2

## 2019-03-20 MED ORDER — HYDROCODONE-ACETAMINOPHEN 5-325 MG PO TABS: 1.0000 | ORAL_TABLET | Freq: Four times a day (QID) | ORAL | 0 refills | Status: AC | PRN

## 2019-03-20 MED ORDER — BUPIVACAINE HCL (PF) 0.25 % IJ SOLN
INTRAMUSCULAR | Status: DC | PRN
  Administered 2019-03-20: 30 mL

## 2019-03-20 MED ORDER — WATER FOR IRRIGATION, STERILE IR SOLN
Status: DC | PRN
  Administered 2019-03-20: 2000 mL

## 2019-03-20 MED ORDER — CHLORHEXIDINE GLUCONATE 4 % EX LIQD: 60.0000 mL | Freq: Once | CUTANEOUS | Status: DC

## 2019-03-20 MED ORDER — PROPOFOL 500 MG/50ML IV EMUL
INTRAVENOUS | Status: DC | PRN
  Administered 2019-03-20: 50 ug/kg/min via INTRAVENOUS

## 2019-03-20 MED ORDER — ACETAMINOPHEN 500 MG PO TABS
1000.0000 mg | ORAL_TABLET | Freq: Once | ORAL | Status: AC
  Administered 2019-03-20: 1000 mg via ORAL
  Filled 2019-03-20: qty 2

## 2019-03-20 MED ORDER — PROPOFOL 10 MG/ML IV BOLUS
INTRAVENOUS | Status: AC
  Filled 2019-03-20: qty 40

## 2019-03-20 MED ORDER — FENTANYL CITRATE (PF) 100 MCG/2ML IJ SOLN
INTRAMUSCULAR | Status: AC
  Filled 2019-03-20: qty 2

## 2019-03-20 MED ORDER — BISACODYL 5 MG PO TBEC: 5.0000 mg | DELAYED_RELEASE_TABLET | Freq: Every day | ORAL | Status: DC | PRN

## 2019-03-20 MED ORDER — DOCUSATE SODIUM 100 MG PO CAPS
100.0000 mg | ORAL_CAPSULE | Freq: Two times a day (BID) | ORAL | Status: DC
  Administered 2019-03-20 – 2019-03-22 (×4): 100 mg via ORAL
  Filled 2019-03-20 (×4): qty 1

## 2019-03-20 MED ORDER — CEFAZOLIN SODIUM-DEXTROSE 2-4 GM/100ML-% IV SOLN
2.0000 g | Freq: Four times a day (QID) | INTRAVENOUS | Status: AC
  Administered 2019-03-20 (×2): 2 g via INTRAVENOUS
  Filled 2019-03-20: qty 100

## 2019-03-20 MED ORDER — ONDANSETRON HCL 4 MG/2ML IJ SOLN
INTRAMUSCULAR | Status: DC | PRN
  Administered 2019-03-20: 4 mg via INTRAVENOUS

## 2019-03-20 MED ORDER — BUPIVACAINE IN DEXTROSE 0.75-8.25 % IT SOLN
INTRATHECAL | Status: DC | PRN
  Administered 2019-03-20: 2 mL via INTRATHECAL

## 2019-03-20 MED ORDER — TRANEXAMIC ACID-NACL 1000-0.7 MG/100ML-% IV SOLN
1000.0000 mg | Freq: Once | INTRAVENOUS | Status: AC
  Administered 2019-03-20: 1000 mg via INTRAVENOUS
  Filled 2019-03-20: qty 100

## 2019-03-20 MED ORDER — FENTANYL CITRATE (PF) 100 MCG/2ML IJ SOLN: 25.0000 ug | INTRAMUSCULAR | Status: DC | PRN

## 2019-03-20 MED ORDER — CELECOXIB 200 MG PO CAPS
200.0000 mg | ORAL_CAPSULE | Freq: Two times a day (BID) | ORAL | Status: DC
  Administered 2019-03-20 – 2019-03-22 (×4): 200 mg via ORAL
  Filled 2019-03-20 (×4): qty 1

## 2019-03-20 MED ORDER — HYDROCODONE-ACETAMINOPHEN 5-325 MG PO TABS
1.0000 | ORAL_TABLET | ORAL | Status: DC | PRN
  Administered 2019-03-20 (×2): 1 via ORAL
  Filled 2019-03-20 (×2): qty 1

## 2019-03-20 MED ORDER — DOCUSATE SODIUM 100 MG PO CAPS: 100.0000 mg | ORAL_CAPSULE | Freq: Two times a day (BID) | ORAL | 0 refills | Status: AC

## 2019-03-20 MED ORDER — PANTOPRAZOLE SODIUM 40 MG PO TBEC
40.0000 mg | DELAYED_RELEASE_TABLET | Freq: Every day | ORAL | Status: DC
  Administered 2019-03-20 – 2019-03-22 (×3): 40 mg via ORAL
  Filled 2019-03-20 (×3): qty 1

## 2019-03-20 MED ORDER — SODIUM CHLORIDE 0.9 % IV SOLN
INTRAVENOUS | Status: DC
  Administered 2019-03-20: 14:00:00 via INTRAVENOUS

## 2019-03-20 MED ORDER — TRANEXAMIC ACID-NACL 1000-0.7 MG/100ML-% IV SOLN
1000.0000 mg | INTRAVENOUS | Status: AC
  Administered 2019-03-20: 1000 mg via INTRAVENOUS
  Filled 2019-03-20: qty 100

## 2019-03-20 MED ORDER — DEXAMETHASONE SODIUM PHOSPHATE 10 MG/ML IJ SOLN
10.0000 mg | Freq: Two times a day (BID) | INTRAMUSCULAR | Status: AC
  Administered 2019-03-21 – 2019-03-22 (×3): 10 mg via INTRAVENOUS
  Filled 2019-03-20 (×3): qty 1

## 2019-03-20 MED ORDER — ONDANSETRON HCL 4 MG PO TABS: 4.0000 mg | ORAL_TABLET | Freq: Four times a day (QID) | ORAL | Status: DC | PRN

## 2019-03-20 MED ORDER — ONDANSETRON HCL 4 MG/2ML IJ SOLN
INTRAMUSCULAR | Status: AC
  Filled 2019-03-20: qty 2

## 2019-03-20 MED ORDER — DIPHENHYDRAMINE HCL 12.5 MG/5ML PO ELIX: 12.5000 mg | ORAL_SOLUTION | ORAL | Status: DC | PRN

## 2019-03-20 MED ORDER — MAGNESIUM CITRATE PO SOLN: 1.0000 | Freq: Once | ORAL | Status: DC | PRN

## 2019-03-20 MED ORDER — POVIDONE-IODINE 10 % EX SWAB: 2.0000 "application " | Freq: Once | CUTANEOUS | Status: DC

## 2019-03-20 MED ORDER — ASPIRIN EC 325 MG PO TBEC
325.0000 mg | DELAYED_RELEASE_TABLET | Freq: Two times a day (BID) | ORAL | Status: DC
  Administered 2019-03-21 – 2019-03-22 (×3): 325 mg via ORAL
  Filled 2019-03-20 (×3): qty 1

## 2019-03-20 MED ORDER — ASPIRIN EC 325 MG PO TBEC: 325.0000 mg | DELAYED_RELEASE_TABLET | Freq: Two times a day (BID) | ORAL | 0 refills | Status: AC

## 2019-03-20 MED ORDER — ONDANSETRON HCL 4 MG/2ML IJ SOLN: 4.0000 mg | Freq: Once | INTRAMUSCULAR | Status: DC | PRN

## 2019-03-20 MED ORDER — CEFAZOLIN SODIUM-DEXTROSE 2-4 GM/100ML-% IV SOLN
2.0000 g | INTRAVENOUS | Status: AC
  Administered 2019-03-20: 09:00:00 2 g via INTRAVENOUS
  Filled 2019-03-20: qty 100

## 2019-03-20 MED ORDER — TIZANIDINE HCL 2 MG PO TABS: 2.0000 mg | ORAL_TABLET | Freq: Three times a day (TID) | ORAL | 0 refills | Status: AC | PRN

## 2019-03-20 MED ORDER — LACTATED RINGERS IV SOLN
INTRAVENOUS | Status: DC
  Administered 2019-03-20: 08:00:00 via INTRAVENOUS

## 2019-03-20 MED ORDER — ALUM & MAG HYDROXIDE-SIMETH 200-200-20 MG/5ML PO SUSP: 30.0000 mL | ORAL | Status: DC | PRN

## 2019-03-20 MED ORDER — MORPHINE SULFATE (PF) 4 MG/ML IV SOLN: 0.5000 mg | INTRAVENOUS | Status: DC | PRN

## 2019-03-20 MED ORDER — BUPIVACAINE LIPOSOME 1.3 % IJ SUSP
INTRAMUSCULAR | Status: DC | PRN
  Administered 2019-03-20: 20 mL

## 2019-03-20 MED ORDER — METHOCARBAMOL 500 MG PO TABS
500.0000 mg | ORAL_TABLET | Freq: Four times a day (QID) | ORAL | Status: DC | PRN
  Administered 2019-03-21 (×2): 500 mg via ORAL
  Filled 2019-03-20 (×2): qty 1

## 2019-03-20 MED ORDER — ONDANSETRON HCL 4 MG/2ML IJ SOLN: 4.0000 mg | Freq: Four times a day (QID) | INTRAMUSCULAR | Status: DC | PRN

## 2019-03-20 SURGICAL SUPPLY — 38 items
ARTICULEZE HEAD (Hips) ×2 IMPLANT
BAG ZIPLOCK 12X15 (MISCELLANEOUS) IMPLANT
BENZOIN TINCTURE PRP APPL 2/3 (GAUZE/BANDAGES/DRESSINGS) ×1 IMPLANT
BLADE SAW SGTL 18X1.27X75 (BLADE) ×2 IMPLANT
BLADE SURG SZ10 CARB STEEL (BLADE) ×4 IMPLANT
COVER PERINEAL POST (MISCELLANEOUS) ×2 IMPLANT
COVER SURGICAL LIGHT HANDLE (MISCELLANEOUS) ×2 IMPLANT
COVER WAND RF STERILE (DRAPES) IMPLANT
CUP PINNACLE 100 SERIES 58MM (Hips) ×1 IMPLANT
DECANTER SPIKE VIAL GLASS SM (MISCELLANEOUS) ×2 IMPLANT
DRAPE STERI IOBAN 125X83 (DRAPES) ×2 IMPLANT
DRAPE U-SHAPE 47X51 STRL (DRAPES) ×4 IMPLANT
DRSG AQUACEL AG ADV 3.5X 6 (GAUZE/BANDAGES/DRESSINGS) ×2 IMPLANT
DURAPREP 26ML APPLICATOR (WOUND CARE) ×2 IMPLANT
ELECT BLADE TIP CTD 4 INCH (ELECTRODE) ×2 IMPLANT
ELECT REM PT RETURN 15FT ADLT (MISCELLANEOUS) ×2 IMPLANT
ELIMINATOR HOLE APEX DEPUY (Hips) ×1 IMPLANT
GAUZE XEROFORM 1X8 LF (GAUZE/BANDAGES/DRESSINGS) IMPLANT
GLOVE BIOGEL PI IND STRL 8 (GLOVE) ×2 IMPLANT
GLOVE BIOGEL PI INDICATOR 8 (GLOVE) ×2
GLOVE ECLIPSE 7.5 STRL STRAW (GLOVE) ×4 IMPLANT
GOWN STRL REUS W/TWL XL LVL3 (GOWN DISPOSABLE) ×4 IMPLANT
HEAD ARTICULEZE (Hips) IMPLANT
HOLDER FOLEY CATH W/STRAP (MISCELLANEOUS) ×2 IMPLANT
HOOD PEEL AWAY FLYTE STAYCOOL (MISCELLANEOUS) ×4 IMPLANT
KIT TURNOVER KIT A (KITS) IMPLANT
LINER NEUTRAL 52X36X58N (Liner) ×1 IMPLANT
NEEDLE HYPO 22GX1.5 SAFETY (NEEDLE) ×2 IMPLANT
PACK ANTERIOR HIP CUSTOM (KITS) ×2 IMPLANT
STAPLER VISISTAT 35W (STAPLE) IMPLANT
STEM CORAIL KA12 (Stem) ×1 IMPLANT
STRIP CLOSURE SKIN 1/2X4 (GAUZE/BANDAGES/DRESSINGS) ×1 IMPLANT
SUT ETHIBOND NAB CT1 #1 30IN (SUTURE) ×4 IMPLANT
SUT MNCRL AB 3-0 PS2 18 (SUTURE) IMPLANT
SUT VIC AB 0 CT1 36 (SUTURE) ×2 IMPLANT
SUT VIC AB 1 CT1 36 (SUTURE) ×2 IMPLANT
TRAY FOLEY MTR SLVR 16FR STAT (SET/KITS/TRAYS/PACK) ×2 IMPLANT
YANKAUER SUCT BULB TIP 10FT TU (MISCELLANEOUS) ×2 IMPLANT

## 2019-03-20 NOTE — Brief Op Note (Signed)
03/20/2019  10:04 AM  PATIENT:  Andre Clark  85 y.o. male  PRE-OPERATIVE DIAGNOSIS:  LEFT HIP OSTEOARTHRITIS  POST-OPERATIVE DIAGNOSIS:  LEFT HIP OSTEOATHRITIS  PROCEDURE:  Procedure(s): TOTAL HIP ARTHROPLASTY ANTERIOR APPROACH (Left)  SURGEON:  Surgeon(s) and Role:    Dorna Leitz, MD - Primary  PHYSICIAN ASSISTANT:   ASSISTANTS: jim bethune   ANESTHESIA:   spinal  EBL:  400 mL   BLOOD ADMINISTERED:none  DRAINS: none   LOCAL MEDICATIONS USED:  MARCAINE    and OTHER experel  SPECIMEN:  No Specimen  DISPOSITION OF SPECIMEN:  N/A  COUNTS:  YES  TOURNIQUET:  * No tourniquets in log *  DICTATION: .Other Dictation: Dictation Number [id number]  PLAN OF CARE: Admit to inpatient   PATIENT DISPOSITION:  PACU - hemodynamically stable.   Delay start of Pharmacological VTE agent (>24hrs) due to surgical blood loss or risk of bleeding: no

## 2019-03-20 NOTE — H&P (Signed)
TOTAL HIP ADMISSION H&P  Patient is admitted for left total hip arthroplasty.  Subjective:  Chief Complaint: left hip pain  HPI: Andre Clark, 85 y.o. male, has a history of pain and functional disability in the left hip(s) due to arthritis and patient has failed non-surgical conservative treatments for greater than 12 weeks to include NSAID's and/or analgesics, weight reduction as appropriate and activity modification.  Onset of symptoms was gradual starting 4 years ago with gradually worsening course since that time.The patient noted no past surgery on the left hip(s).  Patient currently rates pain in the left hip at 9 out of 10 with activity. Patient has night pain, worsening of pain with activity and weight bearing, trendelenberg gait, pain that interfers with activities of daily living and joint swelling. Patient has evidence of subchondral sclerosis, periarticular osteophytes and joint space narrowing by imaging studies. This condition presents safety issues increasing the risk of falls. This patient has had Failure of all reasonable conservative care.  There is no current active infection.  There are no active problems to display for this patient.  Past Medical History:  Diagnosis Date  . Bilateral carpal tunnel syndrome   . GERD (gastroesophageal reflux disease)   . Hepatitis 1951   history of  . Prostate cancer ([place])   . Subcutaneous cyst    Left leg    Past Surgical History:  Procedure Laterality Date  . COLONOSCOPY    . CYST REMOVAL LEG Right   . PROSTATE SURGERY      Current Facility-Administered Medications  Medication Dose Route Frequency Provider Last Rate Last Dose  . acetaminophen (TYLENOL) tablet 1,000 mg  1,000 mg Oral Once Ellender, Ryan P, MD      . bupivacaine liposome (EXPAREL) 1.3 % injection 266 mg  20 mL Other Once Dorna Leitz, MD      . ceFAZolin (ANCEF) IVPB 2g/100 mL premix  2 g Intravenous On Call to OR Dorna Leitz, MD      . chlorhexidine  (HIBICLENS) 4 % liquid 4 application  60 mL Topical Once Dorna Leitz, MD      . lactated ringers infusion   Intravenous Continuous Ellender, Karyl Kinnier, MD      . povidone-iodine 10 % swab 2 application  2 application Topical Once Dorna Leitz, MD      . tranexamic acid (CYKLOKAPRON) IVPB 1,000 mg  1,000 mg Intravenous To OR Dorna Leitz, MD       No Known Allergies  Social History   Tobacco Use  . Smoking status: Never Smoker  . Smokeless tobacco: Never Used  Substance Use Topics  . Alcohol use: Never    Frequency: Never    Family History  Problem Relation Age of Onset  . Lung cancer Father      ROS ROS: I have reviewed the patient's review of systems thoroughly and there are no positive responses as relates to the HPI. Objective:  Physical Exam  Vital signs in last 24 hours: Temp:  [98.3 F (36.8 C)] 98.3 F (36.8 C) (10/30 0737) Pulse Rate:  [71] 71 (10/30 0737) Resp:  [18] 18 (10/30 0737) BP: (153)/(81) 153/81 (10/30 0737) SpO2:  [99 %] 99 % (10/30 0737) Weight:  [79.4 kg] 79.4 kg (10/30 0737) Well-developed well-nourished patient in no acute distress. Alert and oriented x3 HEENT:within normal limits Cardiac: Regular rate and rhythm Pulmonary: Lungs clear to auscultation Abdomen: Soft and nontender.  Normal active bowel sounds  Musculoskeletal: (Left hip: Essentially no range of motion.  Pain with range of motion.  Neurovascular intact distally. Labs: Recent Results (from the past 2160 hour(s))  Surgical pcr screen     Status: None   Collection Time: 03/18/19  9:45 AM   Specimen: Nasal Mucosa; Nasal Swab  Result Value Ref Range   MRSA, PCR NEGATIVE NEGATIVE   Staphylococcus aureus NEGATIVE NEGATIVE    Comment: (NOTE) The Xpert SA Assay (FDA approved for NASAL specimens in patients 30 years of age and older), is one component of a comprehensive surveillance program. It is not intended to diagnose infection nor to guide or monitor treatment. Performed at [facility], [place] [address], [place], [place] [address]   Type and screen Order type and screen if day of surgery is less than 15 days from draw of preadmission visit or order morning of surgery if day of surgery is greater than 6 days from preadmission visit.     Status: None   Collection Time: 03/18/19 10:50 AM  Result Value Ref Range   ABO/RH(D) O NEG    Antibody Screen NEG    Sample Expiration 03/23/2019,2359    Extend sample reason      NO TRANSFUSIONS OR PREGNANCY IN THE PAST 3 MONTHS Performed at [facility], [place] [address], [place], [place] [address]   CBC     Status: None   Collection Time: 03/18/19 10:50 AM  Result Value Ref Range   WBC 6.6 4.0 - 10.5 K/uL   RBC 4.22 4.22 - 5.81 MIL/uL   Hemoglobin 13.4 13.0 - 17.0 g/dL   HCT 41.3 39.0 - 52.0 %   MCV 97.9 80.0 - 100.0 fL   MCH 31.8 26.0 - 34.0 pg   MCHC 32.4 30.0 - 36.0 g/dL   RDW 13.7 11.5 - 15.5 %   Platelets 208 150 - 400 K/uL   nRBC 0.0 0.0 - 0.2 %    Comment: Performed at [facility], [place] [address], [place], [place] [address]  ABO/Rh     Status: None   Collection Time: 03/18/19 10:50 AM  Result Value Ref Range   ABO/RH(D)      Jenetta Downer NEG Performed at [place] [address] [place], [place] [address]   SARS CORONAVIRUS 2 (TAT 6-24 HRS) Nasopharyngeal Nasopharyngeal Swab     Status: None   Collection Time: 03/18/19 11:25 AM   Specimen: Nasopharyngeal Swab  Result Value Ref Range   SARS Coronavirus 2 NEGATIVE NEGATIVE    Comment: (NOTE) SARS-CoV-2 target nucleic acids are NOT DETECTED. The SARS-CoV-2 RNA is generally detectable in upper and lower respiratory specimens during the acute phase of infection. Negative results do not preclude SARS-CoV-2 infection, do not rule out co-infections with other pathogens, and should not be used as the sole basis for treatment or other patient management decisions. Negative results must be combined  with clinical observations, patient history, and epidemiological information. The expected result is Negative. Fact Sheet for Patients: SugarRoll.be Fact Sheet for Healthcare Providers: https://www.woods-mathews.com/ This test is not yet approved or cleared by the [place] FDA and  has been authorized for detection and/or diagnosis of SARS-CoV-2 by FDA under an Emergency Use Authorization (EUA). This EUA will remain  in effect (meaning this test can be used) for the duration of the COVID-19 declaration under Section 56 4(b)(1) of the Act, 21 U.S.C. section 360bbb-3(b)(1), unless the authorization is terminated or revoked sooner. Performed at [facility], [place] [address], [place], [place]  [address]     Estimated body mass index is 29.14 kg/m as calculated from the following:   Height as of this encounter: 5\' 5"  (1.651 m).   Weight as of this encounter: 79.4 kg.   Imaging Review Plain radiographs demonstrate severe degenerative joint disease of the left hip(s). The bone quality appears to be fair for age and reported activity level.      Assessment/Plan:  End stage arthritis, left hip(s)  The patient history, physical examination, clinical judgement of the provider and imaging studies are consistent with end stage degenerative joint disease of the left hip(s) and total hip arthroplasty is deemed medically necessary. The treatment options including medical management, injection therapy, arthroscopy and arthroplasty were discussed at length. The risks and benefits of total hip arthroplasty were presented and reviewed. The risks due to aseptic loosening, infection, stiffness, dislocation/subluxation,  thromboembolic complications and other imponderables were discussed.  The patient acknowledged the explanation, agreed to proceed with the plan and consent was signed. Patient is being admitted for inpatient treatment for surgery, pain  control, PT, OT, prophylactic antibiotics, VTE prophylaxis, progressive ambulation and ADL's and discharge planning.The patient is planning to be discharged home with home health services.  The patient is elderly and although an excellent condition we will keep him in the hospital for greater than 2 midnights for evaluation, pain control, and to make sure his mobility status is adequate.

## 2019-03-20 NOTE — Evaluation (Signed)
Physical Therapy Evaluation Patient Details Name: Andre Clark MRN: [id number] DOB: 1921/01/08 Today's Date: 03/20/2019   History of Present Illness  Patient is 85 y.o. male s/p Lt THA anterior approach on 03/20/19 with PMH significant for GERD, prostate cancer, and bil carpal tunnel syndrome.    Clinical Impression  Andre Clark is a 85 y.o. male POD 0 s/p Lt THA anterior approach. Patient reports modified independence with mobility switching between [facility] and RW at baseline. Patient is now limited by functional impairments (see PT problem list below) and requires min assist for transfers and gait with RW. Patient was able to ambulate ~35 feet with RW and cues for safe step sequencing and proximity to RW at start. Patient instructed in exercise to facilitate ROM and circulation. Patient will benefit from continued skilled PT interventions to address impairments and progress towards PLOF. Acute PT will follow to progress mobility in preparation for safe discharge home.     Follow Up Recommendations Follow surgeon's recommendation for DC plan and follow-up therapies    Equipment Recommendations  None recommended by PT    Recommendations for Other Services       Precautions / Restrictions Precautions Precautions: Fall Restrictions Weight Bearing Restrictions: No      Mobility  Bed Mobility Overal bed mobility: Needs Assistance Bed Mobility: Supine to Sit     Supine to sit: Min guard;HOB elevated     General bed mobility comments: pt using bed rails, no physical assist required to sit up to EOB  Transfers Overall transfer level: Needs assistance Equipment used: Rolling walker (2 wheeled) Transfers: Sit to/from Stand Sit to Stand: Min assist         General transfer comment: cues for safe hand placement and technique with RW, assist required to initiate and complete power up to rise  Ambulation/Gait Ambulation/Gait assistance: Min assist Gait Distance (Feet): 35  Feet Assistive device: Rolling walker (2 wheeled) Gait Pattern/deviations: Decreased stride length;Decreased step length - right;Decreased step length - left;Step-to pattern Gait velocity: decreased   General Gait Details: pt with decreased hip/knee flexion bil LE's, verbal cues to maintain safe proximity to RW at start and pt maintained throughout; no overt LOB noted  Stairs            Wheelchair Mobility    Modified Rankin (Stroke Patients Only)       Balance Overall balance assessment: Needs assistance Sitting-balance support: No upper extremity supported;Feet supported Sitting balance-Leahy Scale: Good     Standing balance support: During functional activity;Bilateral upper extremity supported Standing balance-Leahy Scale: Poor            Pertinent Vitals/Pain Pain Assessment: 0-10 Pain Score: 7  Pain Location: Lt Hip Pain Descriptors / Indicators: Aching;Sore Pain Intervention(s): Limited activity within patient's tolerance;Monitored during session;Repositioned    Home Living Family/patient expects to be discharged to:: Private residence(live at [facility]; [facility] in [place]) Living Arrangements: Spouse/significant other Available Help at Discharge: Family Type of Home: House(duplex) Home Access: Level entry     Home Layout: One level Home Equipment: Environmental consultant - 2 wheels;Walker - 4 wheels;Bedside commode;Cane - single point;Grab bars - tub/shower;Shower seat - built in      Prior Function Level of Independence: Independent with assistive device(s)         Comments: pt has used RW and SPC both to mobilize     Hand Dominance   Dominant Hand: Right    Extremity/Trunk Assessment   Upper Extremity Assessment Upper Extremity Assessment: Overall [facility]  for tasks assessed    Lower Extremity Assessment Lower Extremity Assessment: Overall WFL for tasks assessed;LLE deficits/detail LLE Deficits / Details: pt able to complete heel slide,  quad strength is 4/5 or greater wtih MMT LLE Sensation: WNL LLE Coordination: WNL    Cervical / Trunk Assessment Cervical / Trunk Assessment: Normal  Communication   Communication: No difficulties  Cognition Arousal/Alertness: Awake/alert Behavior During Therapy: WFL for tasks assessed/performed Overall Cognitive Status: Within Functional Limits for tasks assessed           General Comments      Exercises Total Joint Exercises Ankle Circles/Pumps: AROM;10 reps;Seated;Both Quad Sets: AROM;10 reps;Seated;Both Heel Slides: AAROM;Left;10 reps;Seated   Assessment/Plan    PT Assessment Patient needs continued PT services  PT Problem List Decreased strength;Decreased balance;Decreased mobility;Decreased range of motion;Decreased activity tolerance       PT Treatment Interventions DME instruction;Functional mobility training;Balance training;Patient/family education;Modalities;Gait training;Therapeutic activities;Therapeutic exercise;Stair training    PT Goals (Current goals can be found in the Care Plan section)  Acute Rehab PT Goals Patient Stated Goal: to return home and get back to exercising 3x/week; pt wants exercises to work on his leg and arm strength PT Goal Formulation: With patient Time For Goal Achievement: 03/27/19 Potential to Achieve Goals: Good    Frequency 7X/week    AM-PAC PT "6 Clicks" Mobility  Outcome Measure Help needed turning from your back to your side while in a flat bed without using bedrails?: A Little Help needed moving from lying on your back to sitting on the side of a flat bed without using bedrails?: A Little Help needed moving to and from a bed to a chair (including a wheelchair)?: A Little Help needed standing up from a chair using your arms (e.g., wheelchair or bedside chair)?: A Little Help needed to walk in hospital room?: A Little Help needed climbing 3-5 steps with a railing? : A Lot 6 Click Score: 17    End of Session Equipment  Utilized During Treatment: Gait belt Activity Tolerance: Patient tolerated treatment well Patient left: with call bell/phone within reach;in chair;with family/visitor present;with chair alarm set Nurse Communication: Mobility status PT Visit Diagnosis: Muscle weakness (generalized) (M62.81);Difficulty in walking, not elsewhere classified (R26.2);Unsteadiness on feet (R26.81)    Time: 1700-1735 PT Time Calculation (min) (ACUTE ONLY): 35 min   Charges:   PT Evaluation $PT Eval Low Complexity: 1 Low PT Treatments $Therapeutic Exercise: 8-22 mins        Kipp Brood, PT, DPT Physical Therapist with [facility]  03/20/2019 7:36 PM

## 2019-03-20 NOTE — Transfer of Care (Signed)
Immediate Anesthesia Transfer of Care Note  Patient: Andre Clark  Procedure(s) Performed: TOTAL HIP ARTHROPLASTY ANTERIOR APPROACH (Left Hip)  Patient Location: PACU  Anesthesia Type:Spinal  Level of Consciousness: awake, alert  and oriented  Airway & Oxygen Therapy: Patient Spontanous Breathing and Patient connected to face mask oxygen  Post-op Assessment: Report given to RN and Post -op Vital signs reviewed and stable  Post vital signs: Reviewed and stable  Last Vitals:  Vitals Value Taken Time  BP 89/73 03/20/19 1032  Temp    Pulse 75 03/20/19 1033  Resp 18 03/20/19 1034  SpO2 98 % 03/20/19 1033  Vitals shown include unvalidated device data.  Last Pain:  Vitals:   03/20/19 0737  TempSrc: Oral         Complications: No apparent anesthesia complications

## 2019-03-20 NOTE — Anesthesia Preprocedure Evaluation (Addendum)
Anesthesia Evaluation  Patient identified by MRN, date of birth, ID band Patient awake    Reviewed: Allergy & Precautions, NPO status , Patient's Chart, lab work & pertinent test results  Airway Mallampati: II  TM Distance: >3 FB Neck ROM: Full    Dental no notable dental hx.    Pulmonary neg pulmonary ROS,    Pulmonary exam normal breath sounds clear to auscultation       Cardiovascular negative cardio ROS Normal cardiovascular exam Rhythm:Regular Rate:Normal     Neuro/Psych negative neurological ROS  negative psych ROS   GI/Hepatic Neg liver ROS, GERD  Medicated and Controlled,  Endo/Other  negative endocrine ROS  Renal/GU negative Renal ROS     Musculoskeletal  (+) Arthritis ,   Abdominal   Peds  Hematology negative hematology ROS (+)   Anesthesia Other Findings LEFT HIP OSTEOARTHRITIS  Reproductive/Obstetrics                            Anesthesia Physical Anesthesia Plan  ASA: II  Anesthesia Plan: Spinal   Post-op Pain Management:    Induction: Intravenous  PONV Risk Score and Plan: 1 and Ondansetron, Dexamethasone and Treatment may vary due to age or medical condition  Airway Management Planned: Natural Airway  Additional Equipment:   Intra-op Plan:   Post-operative Plan:   Informed Consent: I have reviewed the patients History and Physical, chart, labs and discussed the procedure including the risks, benefits and alternatives for the proposed anesthesia with the patient or authorized representative who has indicated his/her understanding and acceptance.     Dental advisory given  Plan Discussed with: CRNA  Anesthesia Plan Comments:         Anesthesia Quick Evaluation

## 2019-03-20 NOTE — Anesthesia Postprocedure Evaluation (Signed)
Anesthesia Post Note  Patient: Andre Clark  Procedure(s) Performed: TOTAL HIP ARTHROPLASTY ANTERIOR APPROACH (Left Hip)     Patient location during evaluation: PACU Anesthesia Type: Spinal Level of consciousness: oriented and awake Pain management: pain level controlled Vital Signs Assessment: post-procedure vital signs reviewed and stable Respiratory status: spontaneous breathing, respiratory function stable and patient connected to nasal cannula oxygen Cardiovascular status: blood pressure returned to baseline and stable Postop Assessment: no headache, no backache, no apparent nausea or vomiting and spinal receding Anesthetic complications: no    Last Vitals:  Vitals:   03/20/19 1351 03/20/19 1506  BP: (!) 150/78 121/65  Pulse: 62 88  Resp: 17 16  Temp: (!) 36.3 C 36.4 C  SpO2: 100% 98%    Last Pain:  Vitals:   03/20/19 1506  TempSrc: Oral  PainSc:                  Verdell Kincannon P Praneel Haisley

## 2019-03-20 NOTE — Discharge Instructions (Signed)

## 2019-03-20 NOTE — Anesthesia Procedure Notes (Signed)
Spinal  Patient location during procedure: OR Start time: 03/20/2019 8:30 AM End time: 03/20/2019 8:40 AM Staffing Anesthesiologist: Murvin Natal, MD Performed: anesthesiologist  Preanesthetic Checklist Completed: patient identified, surgical consent, pre-op evaluation, timeout performed, IV checked, risks and benefits discussed and monitors and equipment checked Spinal Block Patient position: sitting Prep: DuraPrep Patient monitoring: cardiac monitor, continuous pulse ox and blood pressure Approach: midline Location: L4-5 Injection technique: single-shot Needle Needle type: Pencan  Needle gauge: 24 G Needle length: 9 cm Assessment Sensory level: T10 Additional Notes Functioning IV was confirmed and monitors were applied. Sterile prep and drape, including hand hygiene and sterile gloves were used. The patient was positioned and the spine was prepped. The skin was anesthetized with lidocaine.  Free flow of clear CSF was obtained prior to injecting local anesthetic into the CSF.  The spinal needle aspirated freely following injection.  The needle was carefully withdrawn.  The patient tolerated the procedure well.

## 2019-03-20 NOTE — Op Note (Signed)
Andre Clark, Andre Clark MEDICAL RECORD [id number] ACCOUNT [id number] DATE OF BIRTH:[birth year] FACILITY: [facility] LOCATION: [facility] PHYSICIAN:Jarae Panas L. Willi Borowiak, MD  OPERATIVE REPORT  DATE OF PROCEDURE:  03/20/2019  PREOPERATIVE DIAGNOSIS:  End-stage degenerative joint disease, left hip.  POSTOPERATIVE DIAGNOSIS:  End-stage degenerative joint disease, left hip.  PROCEDURE: 1.  Left total hip replacement with a Corail stem size 12, 58 mm Pinnacle porous coated cup, a neutral liner for 36 head and a metal +5, 36 mm head ball. 2.  Interpretation of multiple intraoperative fluoroscopic images.  SURGEON:  Dorna Leitz, MD  ASSISTANT:  Gaspar Skeeters, PA-C, who was present for the entire case and assisted by manipulation of the leg, manipulation of tissues, and closing to minimize OR time.  BRIEF HISTORY:  The patient is a 85 year old male who is incredibly healthy, who had a right total hip many years ago.  He was having severe left hip pain.  X-ray showed severe arthritis.  He felt that he just could not go on living with the severe pain  that he and was well aware of the risks of his age, although his overall health was outstanding being on no medications.  We felt that it was reasonable to take him to the operating room for left total hip replacement from an anterior approach.  He was  brought to the operating room for this procedure.  DESCRIPTION OF PROCEDURE:  The patient brought to the operating room after adequate anesthesia was obtained with a spinal anesthetic.  The patient was placed supine on the Hana bed.  All bony prominences well padded.  Attention turned to the left hip  where after routine prep and drape, an incision was made for an anterior approach to the hip, subcutaneous tissue to the level of the tensor fascia.  It was opened in line with the fibers and finger dissected.  Retractors were put in place above and  below the neck and at this point the capsulotomy was performed and tagged  and the anterior releases were performed.  Following this, the hip was put in neutral rotation and a provisional neck cut was made followed by placement of retractors above and  below the acetabulum.  Labrectomy was performed.  Acetabulum was sequentially reamed to a level of 57 mm.  A 58 mm Pinnacle porous coated cup was placed 45 degrees of lateral opening, 30 degrees of anteversion.  A hole eliminator was placed followed by a  neutral liner.  All this was done under fluoroscopic imaging.  Once this was completed, attention was turned towards the stem side where the hip was externally rotated, adducted and extended and the canal was opened followed by a chili pepper followed  by sequential rasp up to a level of 12.  The 12 was put in place with a +0 ball.  We did a trial reduction.  It appeared to be short.  We had measured him preoperatively and he was long on the operative side preoperatively even though that was a short  position for him.  At this point, we went ahead and went on to trialing with a +8 ball that made him a little long and little tight.  We went back to a +5 ball, which seemed to be appropriate and at this point, placed the final +5 ball after the 12 final  stem and then placed.  Excellent reduction and range of motion.  We did a stability check, which was normal.  At this point, the wound was irrigated, suctioned dry.  The anterior capsule was closed with 0 Vicryl running.  The anterior capsule was closed  with #1 Vicryl running.  The tensor fascia was closed with 0 Vicryl running, skin with 0 and 2-0 Vicryl and 3-0 Monocryl subcuticular.  Benzoin and Steri-Strips were applied.  Sterile compressive dressing was applied.  The patient was taken to recovery  was noted to be in satisfactory condition.  Exparel was instilled throughout the tissues for postoperative pain control.  Estimated blood loss for procedure was 400 mL but the final can be gotten from the anesthetic record.  Of note,  Gaspar Skeeters was present for the entire case and assisted by manipulation of the leg, manipulation of tissues, and closing to minimize OR time  and we used fluoroscopy multiple times intraoperativcely to assess length positioning and offset.  TN/NUANCE  D:03/20/2019 T:03/20/2019 JOB:[id number]

## 2019-03-21 DIAGNOSIS — M1612 Unilateral primary osteoarthritis, left hip: Secondary | ICD-10-CM | POA: Diagnosis not present

## 2019-03-21 LAB — CBC
HCT: 34.7 % — ABNORMAL LOW (ref 39.0–52.0)
Hemoglobin: 11.2 g/dL — ABNORMAL LOW (ref 13.0–17.0)
MCH: 32.3 pg (ref 26.0–34.0)
MCHC: 32.3 g/dL (ref 30.0–36.0)
MCV: 100 fL (ref 80.0–100.0)
Platelets: 159 10*3/uL (ref 150–400)
RBC: 3.47 MIL/uL — ABNORMAL LOW (ref 4.22–5.81)
RDW: 13.6 % (ref 11.5–15.5)
WBC: 7.3 10*3/uL (ref 4.0–10.5)
nRBC: 0 % (ref 0.0–0.2)

## 2019-03-21 NOTE — Progress Notes (Signed)
Physical Therapy Treatment Patient Details Name: Andre Clark MRN: [id number] DOB: 1920/06/11 Today's Date: 03/21/2019    History of Present Illness Patient is 85 y.o. male s/p Lt THA anterior approach on 03/20/19 with PMH significant for GERD, prostate cancer, and bil carpal tunnel syndrome.    PT Comments    POD # 1 am session Assisted OOB to amb in hallway.  General Gait Details: 25% VC's on proper upright posture and safety with turns.  Then returned to room to perform some TE's following HEP handout.  Instructed on proper tech, freq as well as use of ICE.     Follow Up Recommendations  Follow surgeon's recommendation for DC plan and follow-up therapies     Equipment Recommendations  None recommended by PT    Recommendations for Other Services       Precautions / Restrictions Precautions Precautions: Fall Restrictions Weight Bearing Restrictions: No    Mobility  Bed Mobility Overal bed mobility: Needs Assistance Bed Mobility: Supine to Sit     Supine to sit: Min guard;HOB elevated     General bed mobility comments: increased time.  Demonstarted and instructed how to use belt loop to self assist  Transfers Overall transfer level: Needs assistance Equipment used: Rolling walker (2 wheeled) Transfers: Sit to/from Stand Sit to Stand: Min assist;Min guard         General transfer comment: 25% VC's on proper hand placement and safety with turn completion prior to sit  Ambulation/Gait Ambulation/Gait assistance: Min assist Gait Distance (Feet): 46 Feet Assistive device: Rolling walker (2 wheeled) Gait Pattern/deviations: Decreased stride length;Decreased step length - right;Decreased step length - left;Step-to pattern Gait velocity: decreased   General Gait Details: 25% VC's on proper upright posture and safety with turns   Marine scientist Rankin (Stroke Patients Only)       Balance                                            Cognition Arousal/Alertness: Awake/alert Behavior During Therapy: WFL for tasks assessed/performed Overall Cognitive Status: Within Functional Limits for tasks assessed                                        Exercises      General Comments        Pertinent Vitals/Pain Pain Assessment: No/denies pain Pain Descriptors / Indicators: Aching;Sore;Discomfort Pain Intervention(s): Repositioned;Monitored during session;Premedicated before session;Ice applied    Home Living                      Prior Function            PT Goals (current goals can now be found in the care plan section) Progress towards PT goals: Progressing toward goals    Frequency    7X/week      PT Plan Current plan remains appropriate    Co-evaluation              AM-PAC PT "6 Clicks" Mobility   Outcome Measure  Help needed turning from your back to your side while in a flat bed without using bedrails?: A Little Help needed moving from lying on your back  to sitting on the side of a flat bed without using bedrails?: A Little Help needed moving to and from a bed to a chair (including a wheelchair)?: A Little Help needed standing up from a chair using your arms (e.g., wheelchair or bedside chair)?: A Little Help needed to walk in hospital room?: A Little Help needed climbing 3-5 steps with a railing? : A Lot 6 Click Score: 17    End of Session Equipment Utilized During Treatment: Gait belt Activity Tolerance: Patient tolerated treatment well Patient left: with call bell/phone within reach;in chair;with family/visitor present;with chair alarm set Nurse Communication: Mobility status PT Visit Diagnosis: Muscle weakness (generalized) (M62.81);Difficulty in walking, not elsewhere classified (R26.2);Unsteadiness on feet (R26.81)     Time: [id number] PT Time Calculation (min) (ACUTE ONLY): 32 min  Charges:  $Gait Training: 8-22  mins $Therapeutic Exercise: 8-22 mins                     Rica Koyanagi  PTA Acute  Rehabilitation Services Pager      [phone] Office      [phone]

## 2019-03-21 NOTE — Progress Notes (Signed)
  PATIENT ID: Andre Clark  MRN: [id number]  DOB/AGE:  1921/04/27 / 85 y.o.  1 Day Post-Op Procedure(s) (LRB): TOTAL HIP ARTHROPLASTY ANTERIOR APPROACH (Left)--Dr. Graves  Subjective: Pain is mild.  No c/o chest pain or SOB.   Sitting in bed, breakfast consumed, no N/V Was up and ambulating with PT yday by his report   Objective: Vital signs in last 24 hours: Temp:  [96.1 F (35.6 C)-98.5 F (36.9 C)] 98 F (36.7 C) (10/31 0520) Pulse Rate:  [27-88] 73 (10/31 0520) Resp:  [8-23] 18 (10/31 0520) BP: (82-150)/(58-78) 112/75 (10/31 0520) SpO2:  [95 %-100 %] 100 % (10/31 0520)  Intake/Output from previous day: 10/30 0701 - 10/31 0700 In: 2851.7 [P.O.:420; I.V.:1931.7; IV Piggyback:500] Out: 2870 [Urine:2470; Blood:400] Intake/Output this shift: Total I/O In: 240 [P.O.:240] Out: -   Recent Labs    03/18/19 1050 03/21/19 0302  HGB 13.4 11.2*   Recent Labs    03/18/19 1050 03/21/19 0302  WBC 6.6 7.3  RBC 4.22 3.47*  HCT 41.3 34.7*  PLT 208 159   No results for input(s): NA, K, CL, CO2, BUN, CREATININE, GLUCOSE, CALCIUM in the last 72 hours. No results for input(s): LABPT, INR in the last 72 hours.  Physical Exam: ABD soft Sensation intact distally Dorsiflexion/Plantar flexion intact Incision: dressing C/D/I  Assessment/Plan: 1 Day Post-Op Procedure(s) (LRB): TOTAL HIP ARTHROPLASTY ANTERIOR APPROACH (Left)   Up with therapy Plan for discharge tomorrow , unless does exceptionally well today and desires d/c today Weight Bearing as Tolerated (WBAT) VTE prophylaxis: intermittent pneumatic compression boots   Cherene Dobbins A. [place] [address], [place], [place]  [id number] Office: [phone] Mobile: [phone]  03/21/2019, 8:48 AM

## 2019-03-21 NOTE — Care Management Obs Status (Signed)
[place] NOTIFICATION   Patient Details  Name: Quamane Dearmon MRN: [id number] Date of Birth: 1920-09-13   Medicare Observation Status Notification Given:  Yes    Joaquin Courts, RN 03/21/2019, 5:05 PM

## 2019-03-21 NOTE — Plan of Care (Signed)
  Problem: Pain Management: Goal: Pain level will decrease with appropriate interventions Outcome: Progressing   Problem: Education: Goal: Knowledge of General Education information will improve Description: Including pain rating scale, medication(s)/side effects and non-pharmacologic comfort measures Outcome: Progressing   Problem: Activity: Goal: Risk for activity intolerance will decrease Outcome: Progressing   Problem: Clinical Measurements: Goal: Respiratory complications will improve Outcome: Progressing   Problem: Clinical Measurements: Goal: Cardiovascular complication will be avoided Outcome: Progressing

## 2019-03-21 NOTE — TOC Initial Note (Signed)
Transition of Care [facility]) - Initial/Assessment Note    Patient Details  Name: Andre Clark MRN: [id number] Date of Birth: [birth year]  Transition of Care [facility]) CM/SW Contact:    Joaquin Courts, RN Phone Number: 03/21/2019, 12:58 PM  Clinical Narrative:  CM spoke with patient at bedside. Patient from independent living at [facility]. Patient set up with [facility] for [place]. Reports has rolling walker at home, declines 3-in-1.                   Expected Discharge Plan: [place] Barriers to Discharge: Continued Medical Work up   Patient Goals and CMS Choice Patient states their goals for this hospitalization and ongoing recovery are:: to go back to my apartment CMS Medicare.gov Compare Post Acute Care list provided to:: Patient Choice offered to / list presented to : Patient  Expected Discharge Plan and Services Expected Discharge Plan: [place]   Discharge Planning Services: CM Consult Post Acute Care Choice: [place] arrangements for the past 2 months: Apartment                 DME Arranged: N/A DME Agency: NA       HH Arranged: PT [place] Agency: [facility] (formerly [employer]) Date [place]: 03/21/19 Time [place]: 55 Representative spoke with at [place]: Alwyn Ren  Prior Living Arrangements/Services Living arrangements for the past 2 months: [place] with:: Spouse Patient language and need for interpreter reviewed:: Yes Do you feel safe going back to the place where you live?: Yes      Need for Family Participation in Patient Care: Yes (Comment) Care giver support system in place?: Yes (comment)   Criminal Activity/Legal Involvement Pertinent to Current Situation/Hospitalization: No - Comment as needed  Activities of Daily Living Home Assistive Devices/Equipment: Cane (specify quad or straight), Bedside commode/3-in-1, Walker (specify type) ADL  Screening (condition at time of admission) Patient's cognitive ability adequate to safely complete daily activities?: Yes Is the patient deaf or have difficulty hearing?: No Does the patient have difficulty seeing, even when wearing glasses/contacts?: No Does the patient have difficulty concentrating, remembering, or making decisions?: No Patient able to express need for assistance with ADLs?: Yes Does the patient have difficulty dressing or bathing?: No Independently performs ADLs?: Yes (appropriate for developmental age) Does the patient have difficulty walking or climbing stairs?: Yes Weakness of Legs: Left Weakness of Arms/Hands: None  Permission Sought/Granted   Permission granted to share information with : Yes, Verbal Permission Granted     Permission granted to share info w AGENCY: [facility]        Emotional Assessment Appearance:: Appears stated age Attitude/Demeanor/Rapport: Engaged Affect (typically observed): Accepting Orientation: : Oriented to Place, Oriented to  Time, Oriented to Situation, Oriented to Self   Psych Involvement: No (comment)  Admission diagnosis:  LEFT HIP OSTEOARTHRITIS Patient Active Problem List   Diagnosis Date Noted  . Primary osteoarthritis of left hip 03/20/2019   PCP:  Serita Grammes, MD Pharmacy:   [facility] [place], [place] AT [place] [id number] [place] [address] Phone: [phone] Fax: [phone]     Social Determinants of Health (SDOH) Interventions    Readmission Risk Interventions No flowsheet data found.

## 2019-03-21 NOTE — Progress Notes (Signed)
    Home health agencies that serve [phone].        Home Health Agencies Search Results  Results List Table  Home Health Agency Information Quality of Patient Care Rating Patient Survey Summary Rating  [facility] [phone] 2  out of 5 stars 4 out of 5 stars  [facility] [phone] 3  out of 5 stars 5 out of 5 stars  [facility] [phone] 4 out of 5 stars 4 out of 5 stars  [facility] [phone] 4 out of 5 stars 5 out of 5 stars  [facility] [phone] 4 out of 5 stars 4 out of 5 stars  [facility] [phone] 4  out of 5 stars 4 out of 5 stars  [facility] [phone] 3 out of 5 stars 4 out of 5 stars  [facility] [phone] Not Available4 Not Available12  [facility] [phone] 3  out of 5 stars 5 out of 5 stars  [facility] [phone] 4 out of 5 stars 5 out of 5 stars  [facility] [phone] 3  out of 5 stars 4 out of 5 stars  [facility] [phone] 4  out of 5 stars 4 out of 5 stars   Home Health Footnotes  Footnote number Footnote as displayed on Home Health Compare  1 This agency provides services under a federal waiver program to non-traditional, chronic long term population.  2 This agency provides services to a special needs population.  3 Not Available.  4 The number of patient episodes for this measure is too small to report.  5 This measure currently does not have data or provider has been certified/recertified for less than 6 months.  6 The national average for this measure is not provided because of state-to-state differences in data collection.  7 Medicare is not displaying rates for this measure for any home health agency, because of an issue with the data.  8 There were problems with the data and they are being corrected.  9 Zero, or very few, patients met the  survey's rules for inclusion. The scores shown, if any, reflect a very small number of surveys and may not accurately tell how an agency is doing.  10 Survey results are based on less than 12 months of data.  11 Fewer than 70 patients completed the survey. Use the scores shown, if any, with caution as the number of surveys may be too low to accurately tell how an agency is doing.  12 No survey results are available for this period.  13 Data suppressed by CMS for one or more quarters.    

## 2019-03-21 NOTE — Plan of Care (Signed)
  Problem: Safety: Goal: Ability to remain free from injury will improve Outcome: Progressing   Problem: Pain Managment: Goal: General experience of comfort will improve Outcome: Progressing   Problem: Elimination: Goal: Will not experience complications related to bowel motility Outcome: Progressing   Problem: Elimination: Goal: Will not experience complications related to urinary retention Outcome: Progressing   Problem: Coping: Goal: Level of anxiety will decrease Outcome: Progressing

## 2019-03-21 NOTE — Progress Notes (Signed)
Physical Therapy Treatment Patient Details Name: Andre Clark MRN: [id number] DOB: 18-May-1921 Today's Date: 03/21/2019    History of Present Illness Patient is 85 y.o. male s/p Lt THA anterior approach on 03/20/19 with PMH significant for GERD, prostate cancer, and bil carpal tunnel syndrome.    PT Comments    POD # 1 pm session Spouse present to observe pt's mobility.  Assisted with amb a greater distance. Then returned to room to perform some TE's following HEP handout.  Instructed on proper tech, freq as well as use of ICE.     Follow Up Recommendations  Follow surgeon's recommendation for DC plan and follow-up therapies     Equipment Recommendations  None recommended by PT    Recommendations for Other Services       Precautions / Restrictions Precautions Precautions: Fall Restrictions Weight Bearing Restrictions: No    Mobility  Bed Mobility Overal bed mobility: Needs Assistance Bed Mobility: Supine to Sit     Supine to sit: Min guard;HOB elevated     General bed mobility comments: increased time.  Demonstarted and instructed how to use belt loop to self assist  Transfers Overall transfer level: Needs assistance Equipment used: Rolling walker (2 wheeled) Transfers: Sit to/from Stand Sit to Stand: Min assist;Min guard         General transfer comment: 25% VC's on proper hand placement and safety with turn completion prior to sit  Ambulation/Gait Ambulation/Gait assistance: Min assist Gait Distance (Feet): 52 Feet Assistive device: Rolling walker (2 wheeled) Gait Pattern/deviations: Decreased stride length;Decreased step length - right;Decreased step length - left;Step-to pattern Gait velocity: decreased   General Gait Details: 25% VC's on proper upright posture and safety with turns.  Spouse present to observe   Stairs             Wheelchair Mobility    Modified Rankin (Stroke Patients Only)       Balance                                            Cognition Arousal/Alertness: Awake/alert Behavior During Therapy: WFL for tasks assessed/performed Overall Cognitive Status: Within Functional Limits for tasks assessed                                        Exercises   Total Hip Replacement TE's 10 reps ankle pumps 10 reps knee presses 10 reps heel slides 10 reps SAQ's 10 reps ABD Followed by ICE     General Comments        Pertinent Vitals/Pain Pain Assessment: No/denies pain Pain Descriptors / Indicators: Aching;Sore;Discomfort Pain Intervention(s): Repositioned;Monitored during session;Premedicated before session;Ice applied    Home Living                      Prior Function            PT Goals (current goals can now be found in the care plan section) Progress towards PT goals: Progressing toward goals    Frequency    7X/week      PT Plan Current plan remains appropriate    Co-evaluation              AM-PAC PT "6 Clicks" Mobility   Outcome Measure  Help needed turning from  your back to your side while in a flat bed without using bedrails?: A Little Help needed moving from lying on your back to sitting on the side of a flat bed without using bedrails?: A Little Help needed moving to and from a bed to a chair (including a wheelchair)?: A Little Help needed standing up from a chair using your arms (e.g., wheelchair or bedside chair)?: A Little Help needed to walk in hospital room?: A Little Help needed climbing 3-5 steps with a railing? : A Lot 6 Click Score: 17    End of Session Equipment Utilized During Treatment: Gait belt Activity Tolerance: Patient tolerated treatment well Patient left: with call bell/phone within reach;in chair;with family/visitor present;with chair alarm set Nurse Communication: Mobility status PT Visit Diagnosis: Muscle weakness (generalized) (M62.81);Difficulty in walking, not elsewhere classified  (R26.2);Unsteadiness on feet (R26.81)     Time: 1450-1515 PT Time Calculation (min) (ACUTE ONLY): 25 min  Charges:  $Gait Training: 8-22 mins $Therapeutic Exercise: 8-22 mins                     {Khadeem Rockett  PTA Acute  Rehabilitation Services Pager      [phone] Office      [phone]

## 2019-03-22 DIAGNOSIS — M1612 Unilateral primary osteoarthritis, left hip: Secondary | ICD-10-CM | POA: Diagnosis not present

## 2019-03-22 DIAGNOSIS — Z96642 Presence of left artificial hip joint: Secondary | ICD-10-CM | POA: Diagnosis not present

## 2019-03-22 LAB — CBC
HCT: 36.4 % — ABNORMAL LOW (ref 39.0–52.0)
Hemoglobin: 11.8 g/dL — ABNORMAL LOW (ref 13.0–17.0)
MCH: 31.8 pg (ref 26.0–34.0)
MCHC: 32.4 g/dL (ref 30.0–36.0)
MCV: 98.1 fL (ref 80.0–100.0)
Platelets: 170 10*3/uL (ref 150–400)
RBC: 3.71 MIL/uL — ABNORMAL LOW (ref 4.22–5.81)
RDW: 13.4 % (ref 11.5–15.5)
WBC: 14.4 10*3/uL — ABNORMAL HIGH (ref 4.0–10.5)
nRBC: 0 % (ref 0.0–0.2)

## 2019-03-22 NOTE — Discharge Summary (Signed)
Physician Discharge Summary  Patient ID: Andre Clark MRN: [id number] DOB/AGE: 1920/07/29 85 y.o.  Admit date: 03/20/2019 Discharge date: 03/22/2019  Admission Diagnoses:  Primary osteoarthritis of left hip  Discharge Diagnoses:  Principal Problem:   Primary osteoarthritis of left hip   Past Medical History:  Diagnosis Date  . Bilateral carpal tunnel syndrome   . GERD (gastroesophageal reflux disease)   . Hepatitis 1951   history of  . Prostate cancer ([place])   . Subcutaneous cyst    Left leg    Surgeries: Procedure(s): TOTAL HIP ARTHROPLASTY ANTERIOR APPROACH on 03/20/2019   Consultants (if any):   Discharged Condition: Improved  Hospital Course: Andre Clark is an 85 y.o. male who was admitted 03/20/2019 with a diagnosis of Primary osteoarthritis of left hip and went to the operating room on 03/20/2019 and underwent the above named procedures.    He was given perioperative antibiotics:  Anti-infectives (From admission, onward)   Start     Dose/Rate Route Frequency Ordered Stop   03/20/19 1430  ceFAZolin (ANCEF) IVPB 2g/100 mL premix     2 g 200 mL/hr over 30 Minutes Intravenous Every 6 hours 03/20/19 1130 03/20/19 2014   03/20/19 0745  ceFAZolin (ANCEF) IVPB 2g/100 mL premix     2 g 200 mL/hr over 30 Minutes Intravenous On call to O.R. 03/20/19 [id number] 03/20/19 0859    .  He was given sequential compression devices, early ambulation, and aspirin for DVT prophylaxis.  He benefited maximally from the hospital stay and there were no complications.  On the day of discharge, he had passed PT, tolerating oral diet and meds with pain well-controlled, dressing clean/dry, NVI, and in agreement with plan to d/c home.  Recent vital signs:  Vitals:   03/21/19 2104 03/22/19 0327  BP: (!) 101/53 112/79  Pulse: 68 80  Resp: 16 16  Temp: 97.8 F (36.6 C) 97.7 F (36.5 C)  SpO2: 96% 97%    Recent laboratory studies:  Lab Results  Component Value Date   HGB 11.8 (L)  03/22/2019   HGB 11.2 (L) 03/21/2019   HGB 13.4 03/18/2019   Lab Results  Component Value Date   WBC 14.4 (H) 03/22/2019   PLT 170 03/22/2019   No results found for: INR No results found for: NA, K, CL, CO2, BUN, CREATININE, GLUCOSE  Discharge Medications:   Allergies as of 03/22/2019   No Known Allergies     Medication List    STOP taking these medications   meloxicam 7.5 MG tablet Commonly known as: MOBIC     TAKE these medications   aspirin EC 325 MG tablet Take 1 tablet (325 mg total) by mouth 2 (two) times daily after a meal. Take x 1 month post op to decrease risk of blood clots.   b complex vitamins tablet Take 1 tablet by mouth daily.   cholecalciferol 25 MCG (1000 UT) tablet Commonly known as: VITAMIN D3 Take 1,000 Units by mouth daily.   docusate sodium 100 MG capsule Commonly known as: Colace Take 1 capsule (100 mg total) by mouth 2 (two) times daily.   HYDROcodone-acetaminophen 5-325 MG tablet Commonly known as: Norco Take 1 tablet by mouth every 6 (six) hours as needed for moderate pain.   omeprazole 40 MG capsule Commonly known as: PRILOSEC Take 40 mg by mouth daily.   tiZANidine 2 MG tablet Commonly known as: ZANAFLEX Take 1 tablet (2 mg total) by mouth every 8 (eight) hours as needed for muscle spasms.  vitamin C 250 MG tablet Commonly known as: ASCORBIC ACID Take 250 mg by mouth daily.   vitamin E 400 UNIT capsule Take 400 Units by mouth daily.       Diagnostic Studies: Dg C-arm 1-60 Min-no Report  Result Date: 03/20/2019 CLINICAL DATA:  Left hip arthroplasty. EXAM: OPERATIVE LEFT HIP (WITH PELVIS IF PERFORMED) 4 VIEWS TECHNIQUE: Fluoroscopic spot image(s) were submitted for interpretation post-operatively. COMPARISON:  03/05/2018 FINDINGS: Four C-arm views demonstrate previously described left hip degenerative changes with subsequent placement of a left hip prosthesis. The prosthesis is in satisfactory position and alignment with no  visible fracture or dislocation on these views. Stable previously demonstrated right hip prosthesis. IMPRESSION: 1. Satisfactory postoperative appearance of a left hip prosthesis with no visible hardware complication. 2. Stable right hip prosthesis. Electronically Signed   By: Claudie Revering M.D.   On: 03/20/2019 10:13   Dg Hip Operative Unilat W Or W/o Pelvis Right  Result Date: 03/20/2019 CLINICAL DATA:  Left hip arthroplasty. EXAM: OPERATIVE LEFT HIP (WITH PELVIS IF PERFORMED) 4 VIEWS TECHNIQUE: Fluoroscopic spot image(s) were submitted for interpretation post-operatively. COMPARISON:  03/05/2018 FINDINGS: Four C-arm views demonstrate previously described left hip degenerative changes with subsequent placement of a left hip prosthesis. The prosthesis is in satisfactory position and alignment with no visible fracture or dislocation on these views. Stable previously demonstrated right hip prosthesis. IMPRESSION: 1. Satisfactory postoperative appearance of a left hip prosthesis with no visible hardware complication. 2. Stable right hip prosthesis. Electronically Signed   By: Claudie Revering M.D.   On: 03/20/2019 10:13    Disposition: Discharge disposition: 01-Home or Self Care         Follow-up Information    Dorna Leitz, MD. Schedule an appointment as soon as possible for a visit in 2 weeks.   Specialty: Orthopedic Surgery Contact information: [place] [phone]        [facility] Follow up.   Specialty: [place] Why: agency will provide home health physical therapy, agency will call you to schedule first visit Contact information: [place] [phone]            Signed: Jolyn Nap 03/22/2019, 7:38 AM

## 2019-03-22 NOTE — Progress Notes (Signed)
   03/22/19 1102  PT Visit Information  Last PT Received On 03/22/19  Pt progressing well; reviewed HEP thoroughly. Pt is ready for d/c  From PT standpoint.  Assistance Needed +1  History of Present Illness Patient is 85 y.o. male s/p Lt THA anterior approach on 03/20/19 with PMH significant for GERD, prostate cancer, and bil carpal tunnel syndrome.  Subjective Data  Patient Stated Goal to return home and get back to exercising 3x/week; pt wants exercises to work on his leg and arm strength  Precautions  Precautions Fall  Restrictions  Weight Bearing Restrictions No  Pain Assessment  Pain Assessment 0-10  Pain Score 4  Pain Location Lt Hip  Pain Descriptors / Indicators Grimacing;Tightness;Sore  Pain Intervention(s) Limited activity within patient's tolerance;Monitored during session;Premedicated before session;Repositioned;Ice applied  Cognition  Arousal/Alertness Awake/alert  Behavior During Therapy WFL for tasks assessed/performed  Overall Cognitive Status Within Functional Limits for tasks assessed  Total Joint Exercises  Ankle Circles/Pumps AROM;10 reps;Both  Quad Sets AROM;10 reps;Both  Heel Slides AAROM;10 reps;Left  Short Arc Quad AROM;10 reps;Left  Hip ABduction/ADduction AROM;Left;10 reps  Long Arc Quad AROM;Left;10 reps  PT - End of Session  Equipment Utilized During Treatment Gait belt  Activity Tolerance Patient tolerated treatment well  Patient left in chair;with call bell/phone within reach;with chair alarm set  Nurse Communication Mobility status   PT - Assessment/Plan  PT Plan Current plan remains appropriate  PT Visit Diagnosis Muscle weakness (generalized) (M62.81);Difficulty in walking, not elsewhere classified (R26.2);Unsteadiness on feet (R26.81)  PT Frequency (ACUTE ONLY) 7X/week  Follow Up Recommendations Follow surgeon's recommendation for DC plan and follow-up therapies  PT equipment None recommended by PT  AM-PAC PT "6 Clicks" Mobility Outcome  Measure (Version 2)  Help needed turning from your back to your side while in a flat bed without using bedrails? 3  Help needed moving from lying on your back to sitting on the side of a flat bed without using bedrails? 3  Help needed moving to and from a bed to a chair (including a wheelchair)? 3  Help needed standing up from a chair using your arms (e.g., wheelchair or bedside chair)? 3  Help needed to walk in hospital room? 3  Help needed climbing 3-5 steps with a railing?  3  6 Click Score 18  Consider Recommendation of Discharge To: [facility] with [facility]  PT Goal Progression  Progress towards PT goals Progressing toward goals  Acute Rehab PT Goals  PT Goal Formulation With patient  Time For Goal Achievement 03/27/19  Potential to Achieve Goals Good  PT Time Calculation  PT Start Time (ACUTE ONLY) 1015  PT Stop Time (ACUTE ONLY) 1025  PT Time Calculation (min) (ACUTE ONLY) 10 min  PT General Charges  $$ ACUTE PT VISIT 1 Visit  PT Treatments  $Therapeutic Exercise 8-22 mins

## 2019-03-22 NOTE — Plan of Care (Signed)
Pt to d/c home with family. Pt has no home equipment needs.

## 2019-03-22 NOTE — Progress Notes (Signed)
Physical Therapy Treatment Patient Details Name: Andre Clark MRN: [id number] DOB: 10/15/1920 Today's Date: 03/22/2019    History of Present Illness Patient is 85 y.o. male s/p Lt THA anterior approach on 03/20/19 with PMH significant for GERD, prostate cancer, and bil carpal tunnel syndrome.    PT Comments    Pt progressing well. incr amb distance/toelrance today; will see again to review HEP. Pt states he does not recall doing any exercises   Follow Up Recommendations  Follow surgeon's recommendation for DC plan and follow-up therapies     Equipment Recommendations  None recommended by PT    Recommendations for Other Services       Precautions / Restrictions Precautions Precautions: Fall Restrictions Weight Bearing Restrictions: No    Mobility  Bed Mobility Overal bed mobility: Needs Assistance Bed Mobility: Supine to Sit     Supine to sit: Supervision     General bed mobility comments: increased time, bed flat however no physical assist   Transfers Overall transfer level: Needs assistance Equipment used: Rolling walker (2 wheeled) Transfers: Sit to/from Stand Sit to Stand: Min guard;Supervision         General transfer comment: cues for hand placement   Ambulation/Gait Ambulation/Gait assistance: Min guard Gait Distance (Feet): 120 Feet Assistive device: Rolling walker (2 wheeled) Gait Pattern/deviations: Decreased stride length;Decreased step length - right;Decreased step length - left;Step-to pattern     General Gait Details: cues for Rw position, trunk extension and step length    Stairs             Wheelchair Mobility    Modified Rankin (Stroke Patients Only)       Balance   Sitting-balance support: No upper extremity supported;Feet supported Sitting balance-Leahy Scale: Good     Standing balance support: During functional activity;Bilateral upper extremity supported Standing balance-Leahy Scale: Poor(reliant on UEs)                               Cognition Arousal/Alertness: Awake/alert Behavior During Therapy: WFL for tasks assessed/performed Overall Cognitive Status: Within Functional Limits for tasks assessed                                        Exercises      General Comments        Pertinent Vitals/Pain Pain Assessment: 0-10 Pain Score: 2  Pain Location: Lt Hip Pain Descriptors / Indicators: Grimacing;Tightness;Sore Pain Intervention(s): Limited activity within patient's tolerance;Monitored during session;Premedicated before session;Repositioned;Ice applied    Home Living                      Prior Function            PT Goals (current goals can now be found in the care plan section) Acute Rehab PT Goals Patient Stated Goal: to return home and get back to exercising 3x/week; pt wants exercises to work on his leg and arm strength PT Goal Formulation: With patient Time For Goal Achievement: 03/27/19 Potential to Achieve Goals: Good Progress towards PT goals: Progressing toward goals    Frequency    7X/week      PT Plan Current plan remains appropriate    Co-evaluation              AM-PAC PT "6 Clicks" Mobility   Outcome Measure  Help needed  turning from your back to your side while in a flat bed without using bedrails?: A Little Help needed moving from lying on your back to sitting on the side of a flat bed without using bedrails?: A Little Help needed moving to and from a bed to a chair (including a wheelchair)?: A Little Help needed standing up from a chair using your arms (e.g., wheelchair or bedside chair)?: A Little Help needed to walk in hospital room?: A Little Help needed climbing 3-5 steps with a railing? : A Little 6 Click Score: 18    End of Session Equipment Utilized During Treatment: Gait belt Activity Tolerance: Patient tolerated treatment well Patient left: in chair;with call bell/phone within reach;with chair  alarm set   PT Visit Diagnosis: Muscle weakness (generalized) (M62.81);Difficulty in walking, not elsewhere classified (R26.2);Unsteadiness on feet (R26.81)     Time: 0955-1006 PT Time Calculation (min) (ACUTE ONLY): 11 min  Charges:  $Gait Training: 8-22 mins                     Kenyon Ana, PT  Pager: [phone] Acute Rehab Dept [facility]): [phone]   03/22/2019    [facility] 03/22/2019, 10:13 AM

## 2019-03-22 NOTE — Progress Notes (Signed)
Pt d/c home with family in stable condition. No needs at time of d/c. Pt dressing within normal limits.

## 2019-03-23 ENCOUNTER — Encounter (HOSPITAL_COMMUNITY): Payer: Self-pay | Admitting: Orthopedic Surgery

## 2019-03-23 DIAGNOSIS — K219 Gastro-esophageal reflux disease without esophagitis: Secondary | ICD-10-CM | POA: Diagnosis not present

## 2019-03-23 DIAGNOSIS — Z8546 Personal history of malignant neoplasm of prostate: Secondary | ICD-10-CM | POA: Diagnosis not present

## 2019-03-23 DIAGNOSIS — Z7982 Long term (current) use of aspirin: Secondary | ICD-10-CM | POA: Diagnosis not present

## 2019-03-23 DIAGNOSIS — K759 Inflammatory liver disease, unspecified: Secondary | ICD-10-CM | POA: Diagnosis not present

## 2019-03-23 DIAGNOSIS — Z96642 Presence of left artificial hip joint: Secondary | ICD-10-CM | POA: Diagnosis not present

## 2019-03-23 DIAGNOSIS — Z471 Aftercare following joint replacement surgery: Secondary | ICD-10-CM | POA: Diagnosis not present

## 2019-04-02 DIAGNOSIS — M1612 Unilateral primary osteoarthritis, left hip: Secondary | ICD-10-CM | POA: Diagnosis not present

## 2019-04-06 DIAGNOSIS — Z6830 Body mass index (BMI) 30.0-30.9, adult: Secondary | ICD-10-CM | POA: Diagnosis not present

## 2019-04-06 DIAGNOSIS — Z96642 Presence of left artificial hip joint: Secondary | ICD-10-CM | POA: Diagnosis not present

## 2019-04-06 DIAGNOSIS — D62 Acute posthemorrhagic anemia: Secondary | ICD-10-CM | POA: Diagnosis not present

## 2019-04-06 DIAGNOSIS — M25519 Pain in unspecified shoulder: Secondary | ICD-10-CM | POA: Diagnosis not present

## 2019-04-09 DIAGNOSIS — D510 Vitamin B12 deficiency anemia due to intrinsic factor deficiency: Secondary | ICD-10-CM | POA: Diagnosis not present

## 2019-04-12 ENCOUNTER — Inpatient Hospital Stay: Admission: AD | Admit: 2019-04-12 | Payer: PPO | Source: Other Acute Inpatient Hospital | Admitting: Internal Medicine

## 2019-04-12 DIAGNOSIS — D649 Anemia, unspecified: Secondary | ICD-10-CM | POA: Diagnosis not present

## 2019-04-12 DIAGNOSIS — Z03818 Encounter for observation for suspected exposure to other biological agents ruled out: Secondary | ICD-10-CM | POA: Diagnosis not present

## 2019-04-12 DIAGNOSIS — K219 Gastro-esophageal reflux disease without esophagitis: Secondary | ICD-10-CM | POA: Diagnosis not present

## 2019-04-12 DIAGNOSIS — R600 Localized edema: Secondary | ICD-10-CM | POA: Diagnosis not present

## 2019-04-12 DIAGNOSIS — T8459XA Infection and inflammatory reaction due to other internal joint prosthesis, initial encounter: Secondary | ICD-10-CM | POA: Diagnosis not present

## 2019-04-12 DIAGNOSIS — L03116 Cellulitis of left lower limb: Secondary | ICD-10-CM | POA: Diagnosis not present

## 2019-04-12 DIAGNOSIS — M7989 Other specified soft tissue disorders: Secondary | ICD-10-CM | POA: Diagnosis not present

## 2019-04-12 DIAGNOSIS — M79662 Pain in left lower leg: Secondary | ICD-10-CM | POA: Diagnosis not present

## 2019-04-12 DIAGNOSIS — I700 Atherosclerosis of aorta: Secondary | ICD-10-CM | POA: Diagnosis not present

## 2019-04-14 DIAGNOSIS — L03116 Cellulitis of left lower limb: Secondary | ICD-10-CM | POA: Diagnosis not present

## 2019-04-14 DIAGNOSIS — R600 Localized edema: Secondary | ICD-10-CM | POA: Diagnosis not present

## 2019-04-20 DIAGNOSIS — Z23 Encounter for immunization: Secondary | ICD-10-CM | POA: Diagnosis not present

## 2019-04-20 DIAGNOSIS — Z96642 Presence of left artificial hip joint: Secondary | ICD-10-CM | POA: Diagnosis not present

## 2019-04-20 DIAGNOSIS — R195 Other fecal abnormalities: Secondary | ICD-10-CM | POA: Diagnosis not present

## 2019-04-20 DIAGNOSIS — L03116 Cellulitis of left lower limb: Secondary | ICD-10-CM | POA: Diagnosis not present

## 2019-04-20 DIAGNOSIS — Z7689 Persons encountering health services in other specified circumstances: Secondary | ICD-10-CM | POA: Diagnosis not present

## 2019-04-20 DIAGNOSIS — D649 Anemia, unspecified: Secondary | ICD-10-CM | POA: Diagnosis not present

## 2019-04-20 DIAGNOSIS — Z6828 Body mass index (BMI) 28.0-28.9, adult: Secondary | ICD-10-CM | POA: Diagnosis not present

## 2019-04-20 DIAGNOSIS — Z2821 Immunization not carried out because of patient refusal: Secondary | ICD-10-CM | POA: Diagnosis not present

## 2019-04-21 DIAGNOSIS — M25652 Stiffness of left hip, not elsewhere classified: Secondary | ICD-10-CM | POA: Diagnosis not present

## 2019-04-21 DIAGNOSIS — M25552 Pain in left hip: Secondary | ICD-10-CM | POA: Diagnosis not present

## 2019-04-28 DIAGNOSIS — M25652 Stiffness of left hip, not elsewhere classified: Secondary | ICD-10-CM | POA: Diagnosis not present

## 2019-04-28 DIAGNOSIS — M25552 Pain in left hip: Secondary | ICD-10-CM | POA: Diagnosis not present

## 2019-05-05 DIAGNOSIS — Z471 Aftercare following joint replacement surgery: Secondary | ICD-10-CM | POA: Diagnosis not present

## 2019-05-05 DIAGNOSIS — L03116 Cellulitis of left lower limb: Secondary | ICD-10-CM | POA: Diagnosis not present

## 2019-06-09 DIAGNOSIS — M25552 Pain in left hip: Secondary | ICD-10-CM | POA: Diagnosis not present

## 2019-08-20 DIAGNOSIS — K921 Melena: Secondary | ICD-10-CM | POA: Diagnosis not present

## 2019-08-20 DIAGNOSIS — K920 Hematemesis: Secondary | ICD-10-CM | POA: Diagnosis not present

## 2019-08-20 DIAGNOSIS — K219 Gastro-esophageal reflux disease without esophagitis: Secondary | ICD-10-CM | POA: Diagnosis not present

## 2019-08-27 DIAGNOSIS — M25552 Pain in left hip: Secondary | ICD-10-CM | POA: Diagnosis not present

## 2019-08-27 DIAGNOSIS — Z96642 Presence of left artificial hip joint: Secondary | ICD-10-CM | POA: Diagnosis not present

## 2019-11-11 DIAGNOSIS — H5202 Hypermetropia, left eye: Secondary | ICD-10-CM | POA: Diagnosis not present

## 2019-11-11 DIAGNOSIS — H43813 Vitreous degeneration, bilateral: Secondary | ICD-10-CM | POA: Diagnosis not present

## 2019-11-11 DIAGNOSIS — H04123 Dry eye syndrome of bilateral lacrimal glands: Secondary | ICD-10-CM | POA: Diagnosis not present

## 2019-11-11 DIAGNOSIS — Z961 Presence of intraocular lens: Secondary | ICD-10-CM | POA: Diagnosis not present

## 2019-11-11 DIAGNOSIS — H52203 Unspecified astigmatism, bilateral: Secondary | ICD-10-CM | POA: Diagnosis not present

## 2019-11-11 DIAGNOSIS — H524 Presbyopia: Secondary | ICD-10-CM | POA: Diagnosis not present

## 2019-11-11 DIAGNOSIS — H43393 Other vitreous opacities, bilateral: Secondary | ICD-10-CM | POA: Diagnosis not present

## 2019-12-11 DIAGNOSIS — R600 Localized edema: Secondary | ICD-10-CM | POA: Diagnosis not present

## 2019-12-11 DIAGNOSIS — M79661 Pain in right lower leg: Secondary | ICD-10-CM | POA: Diagnosis not present

## 2019-12-11 DIAGNOSIS — M7989 Other specified soft tissue disorders: Secondary | ICD-10-CM | POA: Diagnosis not present

## 2019-12-11 DIAGNOSIS — K219 Gastro-esophageal reflux disease without esophagitis: Secondary | ICD-10-CM | POA: Diagnosis not present

## 2019-12-11 DIAGNOSIS — M79604 Pain in right leg: Secondary | ICD-10-CM | POA: Diagnosis not present

## 2019-12-15 ENCOUNTER — Other Ambulatory Visit: Payer: Self-pay

## 2019-12-16 DIAGNOSIS — Z13220 Encounter for screening for lipoid disorders: Secondary | ICD-10-CM | POA: Diagnosis not present

## 2019-12-16 DIAGNOSIS — Z6830 Body mass index (BMI) 30.0-30.9, adult: Secondary | ICD-10-CM | POA: Diagnosis not present

## 2019-12-16 DIAGNOSIS — Z79899 Other long term (current) drug therapy: Secondary | ICD-10-CM | POA: Diagnosis not present

## 2019-12-16 DIAGNOSIS — D649 Anemia, unspecified: Secondary | ICD-10-CM | POA: Diagnosis not present

## 2019-12-16 DIAGNOSIS — Z131 Encounter for screening for diabetes mellitus: Secondary | ICD-10-CM | POA: Diagnosis not present

## 2019-12-16 DIAGNOSIS — Z125 Encounter for screening for malignant neoplasm of prostate: Secondary | ICD-10-CM | POA: Diagnosis not present

## 2019-12-16 DIAGNOSIS — L03115 Cellulitis of right lower limb: Secondary | ICD-10-CM | POA: Diagnosis not present

## 2019-12-16 DIAGNOSIS — K219 Gastro-esophageal reflux disease without esophagitis: Secondary | ICD-10-CM | POA: Diagnosis not present

## 2019-12-16 DIAGNOSIS — Z0000 Encounter for general adult medical examination without abnormal findings: Secondary | ICD-10-CM | POA: Diagnosis not present

## 2019-12-16 DIAGNOSIS — Z1331 Encounter for screening for depression: Secondary | ICD-10-CM | POA: Diagnosis not present

## 2019-12-24 DIAGNOSIS — M7989 Other specified soft tissue disorders: Secondary | ICD-10-CM | POA: Diagnosis not present

## 2019-12-24 DIAGNOSIS — Z6830 Body mass index (BMI) 30.0-30.9, adult: Secondary | ICD-10-CM | POA: Diagnosis not present

## 2019-12-24 DIAGNOSIS — R0600 Dyspnea, unspecified: Secondary | ICD-10-CM | POA: Diagnosis not present

## 2019-12-24 DIAGNOSIS — L03115 Cellulitis of right lower limb: Secondary | ICD-10-CM | POA: Diagnosis not present

## 2019-12-24 DIAGNOSIS — R972 Elevated prostate specific antigen [PSA]: Secondary | ICD-10-CM | POA: Diagnosis not present

## 2019-12-24 DIAGNOSIS — R252 Cramp and spasm: Secondary | ICD-10-CM | POA: Diagnosis not present

## 2020-01-06 DIAGNOSIS — Z79899 Other long term (current) drug therapy: Secondary | ICD-10-CM | POA: Diagnosis not present

## 2020-01-16 DIAGNOSIS — S0502XA Injury of conjunctiva and corneal abrasion without foreign body, left eye, initial encounter: Secondary | ICD-10-CM | POA: Diagnosis not present

## 2020-05-24 DIAGNOSIS — R0789 Other chest pain: Secondary | ICD-10-CM | POA: Diagnosis not present

## 2020-05-24 DIAGNOSIS — R58 Hemorrhage, not elsewhere classified: Secondary | ICD-10-CM | POA: Diagnosis not present

## 2020-05-24 DIAGNOSIS — S0191XA Laceration without foreign body of unspecified part of head, initial encounter: Secondary | ICD-10-CM | POA: Diagnosis not present

## 2020-05-24 DIAGNOSIS — K219 Gastro-esophageal reflux disease without esophagitis: Secondary | ICD-10-CM | POA: Diagnosis not present

## 2020-05-24 DIAGNOSIS — N400 Enlarged prostate without lower urinary tract symptoms: Secondary | ICD-10-CM | POA: Diagnosis not present

## 2020-05-24 DIAGNOSIS — M25519 Pain in unspecified shoulder: Secondary | ICD-10-CM | POA: Diagnosis not present

## 2020-05-24 DIAGNOSIS — S2232XA Fracture of one rib, left side, initial encounter for closed fracture: Secondary | ICD-10-CM | POA: Diagnosis not present

## 2020-05-24 DIAGNOSIS — R52 Pain, unspecified: Secondary | ICD-10-CM | POA: Diagnosis not present

## 2020-05-24 DIAGNOSIS — S5011XA Contusion of right forearm, initial encounter: Secondary | ICD-10-CM | POA: Diagnosis not present

## 2020-05-24 DIAGNOSIS — R0781 Pleurodynia: Secondary | ICD-10-CM | POA: Diagnosis not present

## 2020-05-24 DIAGNOSIS — S3991XA Unspecified injury of abdomen, initial encounter: Secondary | ICD-10-CM | POA: Diagnosis not present

## 2020-05-24 DIAGNOSIS — M2500 Hemarthrosis, unspecified joint: Secondary | ICD-10-CM | POA: Diagnosis not present

## 2020-05-24 DIAGNOSIS — S01111A Laceration without foreign body of right eyelid and periocular area, initial encounter: Secondary | ICD-10-CM | POA: Diagnosis not present

## 2020-05-24 DIAGNOSIS — Z041 Encounter for examination and observation following transport accident: Secondary | ICD-10-CM | POA: Diagnosis not present

## 2020-05-24 DIAGNOSIS — R609 Edema, unspecified: Secondary | ICD-10-CM | POA: Diagnosis not present

## 2020-05-24 DIAGNOSIS — M25511 Pain in right shoulder: Secondary | ICD-10-CM | POA: Diagnosis not present

## 2020-05-31 DIAGNOSIS — M19019 Primary osteoarthritis, unspecified shoulder: Secondary | ICD-10-CM | POA: Diagnosis not present

## 2020-06-01 DIAGNOSIS — K920 Hematemesis: Secondary | ICD-10-CM | POA: Diagnosis not present

## 2020-06-01 DIAGNOSIS — K449 Diaphragmatic hernia without obstruction or gangrene: Secondary | ICD-10-CM | POA: Diagnosis not present

## 2020-06-01 DIAGNOSIS — K219 Gastro-esophageal reflux disease without esophagitis: Secondary | ICD-10-CM | POA: Diagnosis not present

## 2020-06-01 DIAGNOSIS — Z6829 Body mass index (BMI) 29.0-29.9, adult: Secondary | ICD-10-CM | POA: Diagnosis not present

## 2020-06-14 DIAGNOSIS — H02401 Unspecified ptosis of right eyelid: Secondary | ICD-10-CM | POA: Diagnosis not present

## 2020-06-14 DIAGNOSIS — H524 Presbyopia: Secondary | ICD-10-CM | POA: Diagnosis not present

## 2020-06-14 DIAGNOSIS — H5203 Hypermetropia, bilateral: Secondary | ICD-10-CM | POA: Diagnosis not present

## 2020-06-14 DIAGNOSIS — H52203 Unspecified astigmatism, bilateral: Secondary | ICD-10-CM | POA: Diagnosis not present

## 2020-06-22 DIAGNOSIS — S01111A Laceration without foreign body of right eyelid and periocular area, initial encounter: Secondary | ICD-10-CM | POA: Diagnosis not present

## 2020-06-22 DIAGNOSIS — S0181XD Laceration without foreign body of other part of head, subsequent encounter: Secondary | ICD-10-CM | POA: Diagnosis not present

## 2020-06-22 DIAGNOSIS — Z6830 Body mass index (BMI) 30.0-30.9, adult: Secondary | ICD-10-CM | POA: Diagnosis not present

## 2020-09-22 IMAGING — RF DG C-ARM 1-60 MIN-NO REPORT
1 series · 4 of 4 positions shown · non-contrast
Comparison: 03/05/2018

CLINICAL DATA: Left hip arthroplasty.

EXAM:
OPERATIVE LEFT HIP (WITH PELVIS IF PERFORMED) 4 VIEWS
TECHNIQUE: Fluoroscopic spot image(s) were submitted for interpretation
post-operatively.

[Series 1: run · 4 of 4 slices shown]
[im 1/4]
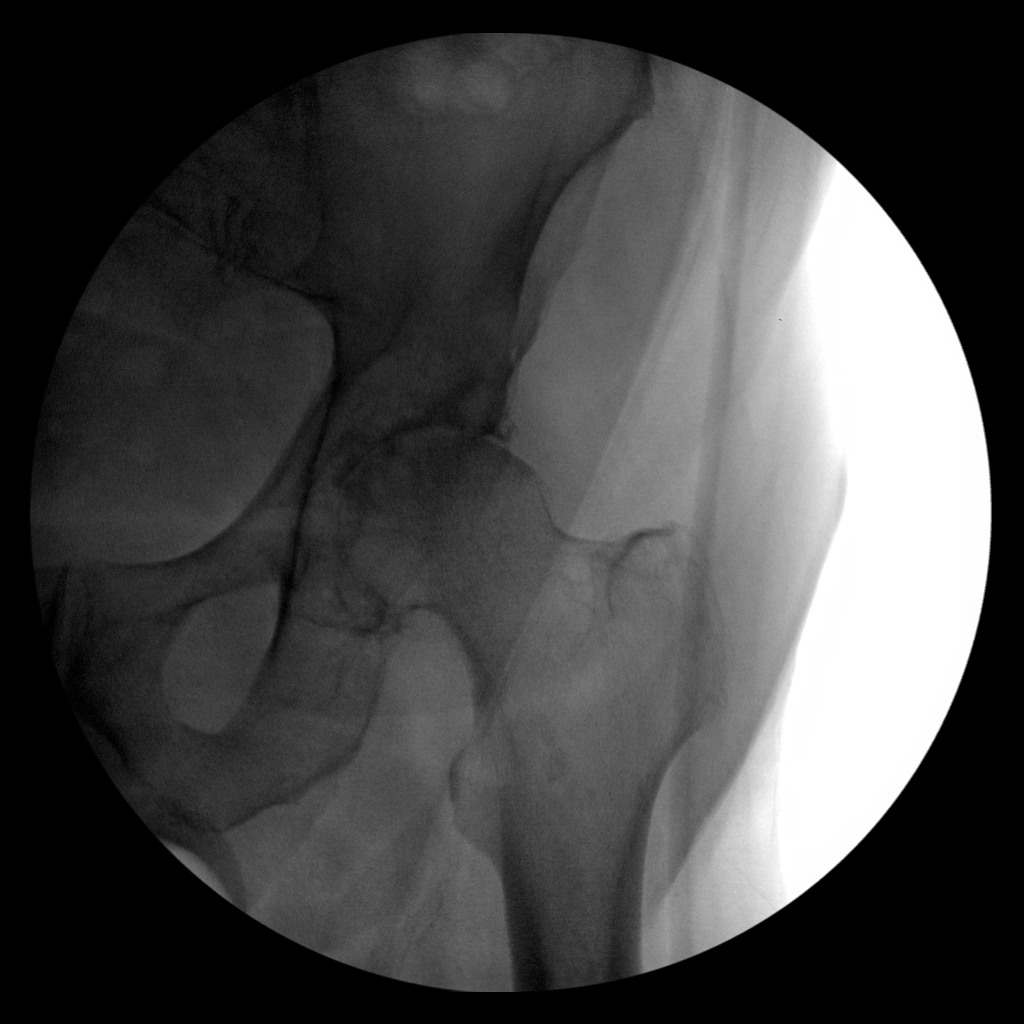
[im 2/4]
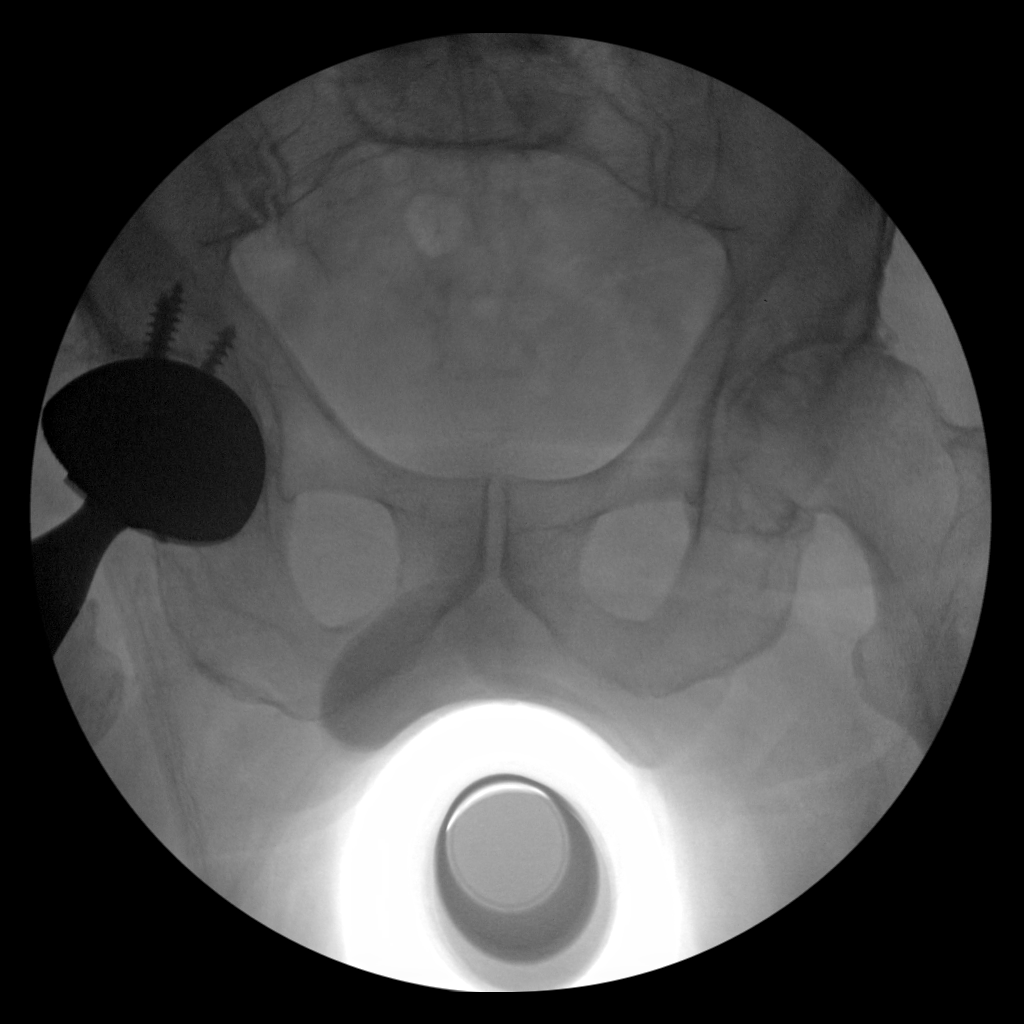
[im 3/4]
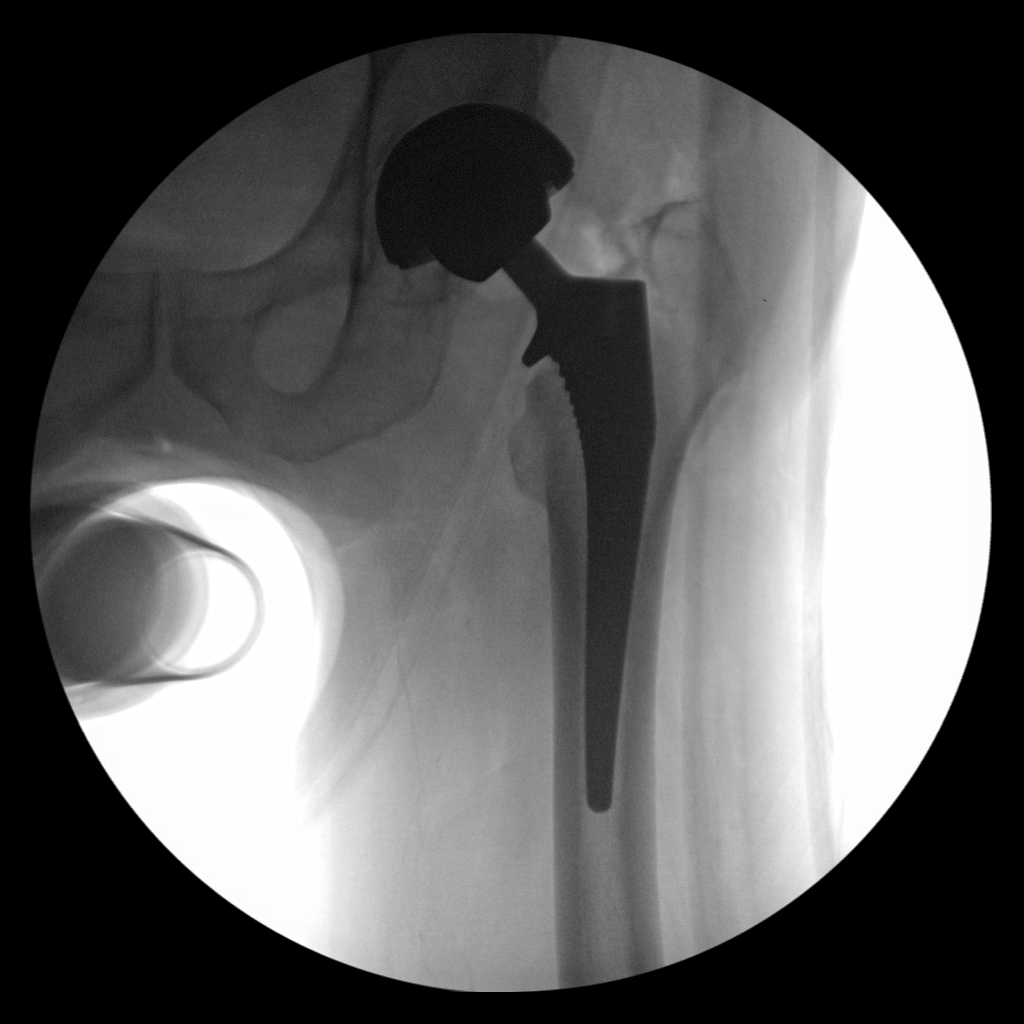
[im 4/4]
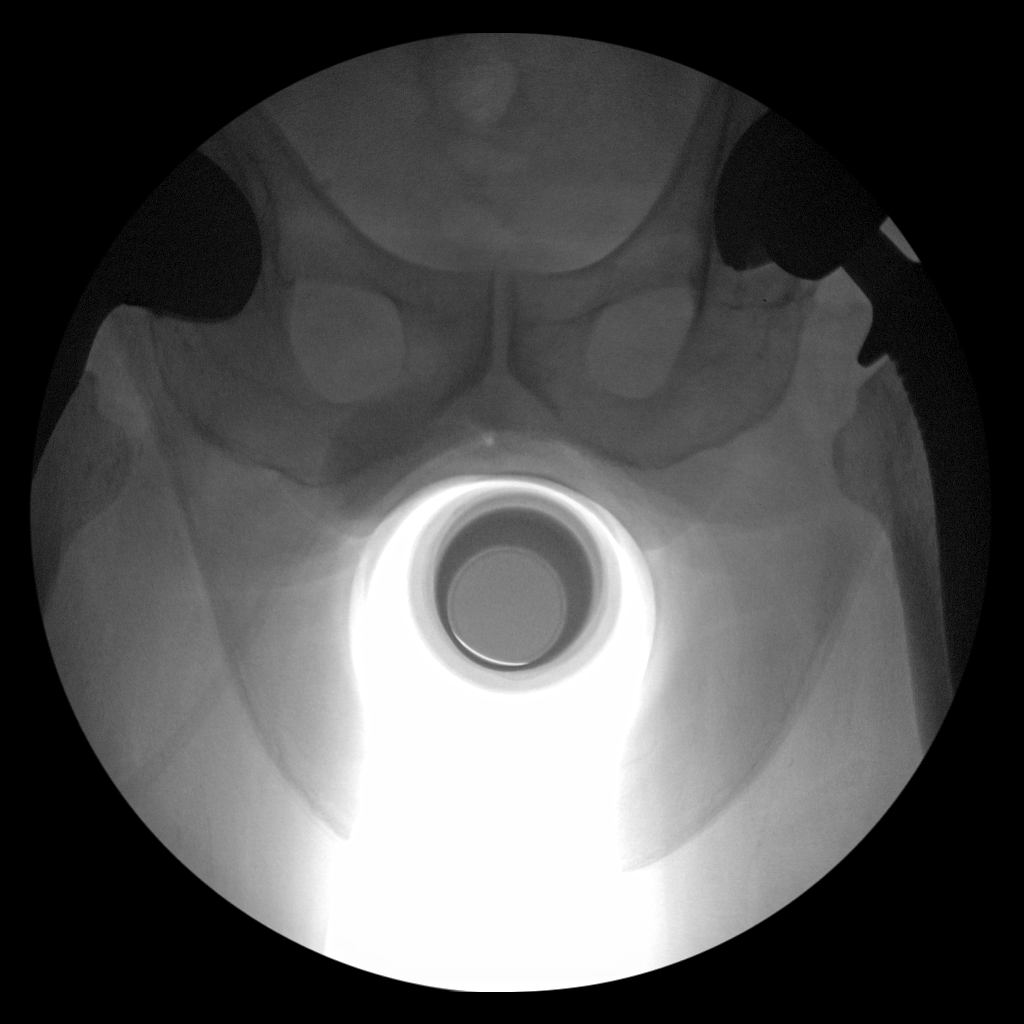

[4 of 4 positions shown; findings below may reference images not displayed]

FINDINGS: Four C-arm views demonstrate previously described left hip
degenerative changes with subsequent placement of a left hip
prosthesis. The prosthesis is in satisfactory position and alignment
with no visible fracture or dislocation on these views. Stable
previously demonstrated right hip prosthesis.
IMPRESSION: 1. Satisfactory postoperative appearance of a left hip prosthesis
with no visible hardware complication.
2. Stable right hip prosthesis.

## 2020-09-22 IMAGING — RF DG HIP (WITH PELVIS) OPERATIVE*R*
1 series · 4 of 4 positions shown · non-contrast
Comparison: 03/05/2018

CLINICAL DATA: Left hip arthroplasty.

EXAM:
OPERATIVE LEFT HIP (WITH PELVIS IF PERFORMED) 4 VIEWS
TECHNIQUE: Fluoroscopic spot image(s) were submitted for interpretation
post-operatively.

[Series 1: run · 4 of 4 slices shown]
[im 1/4]
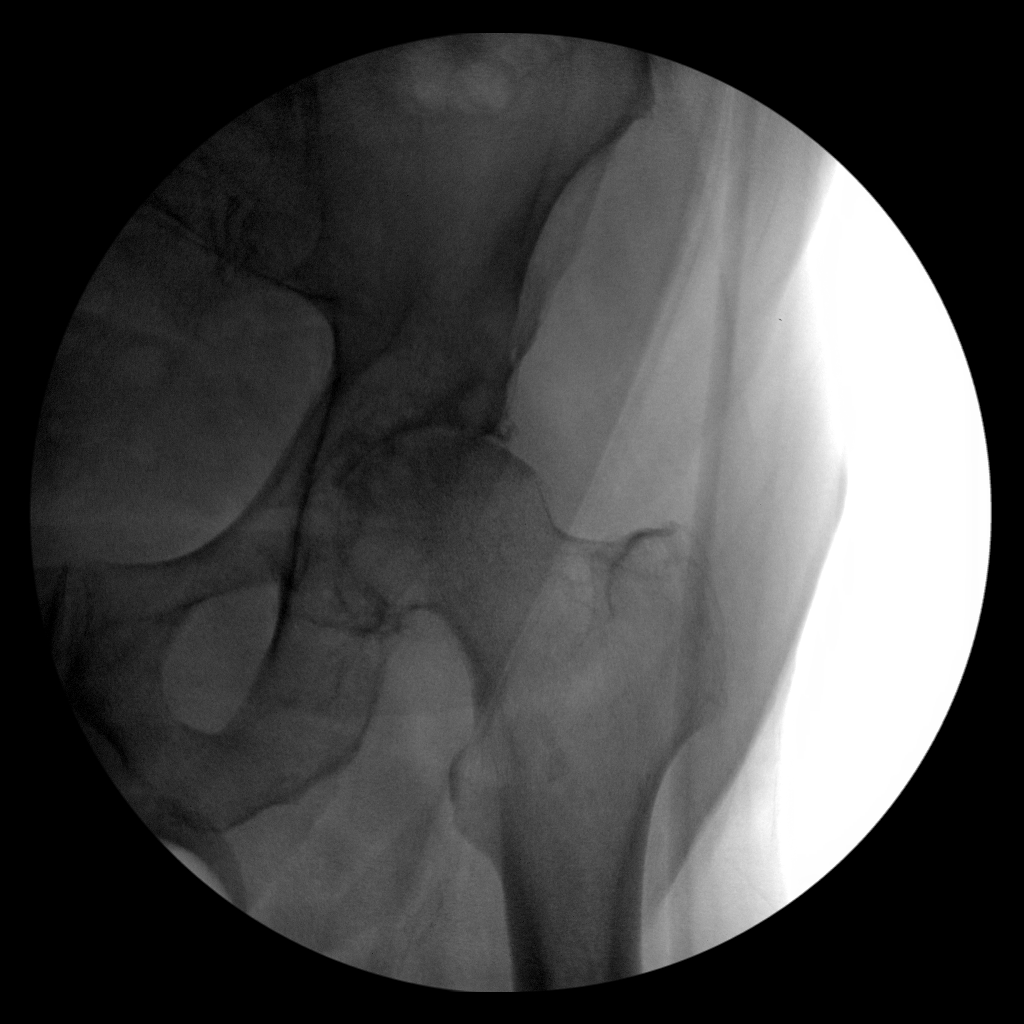
[im 2/4]
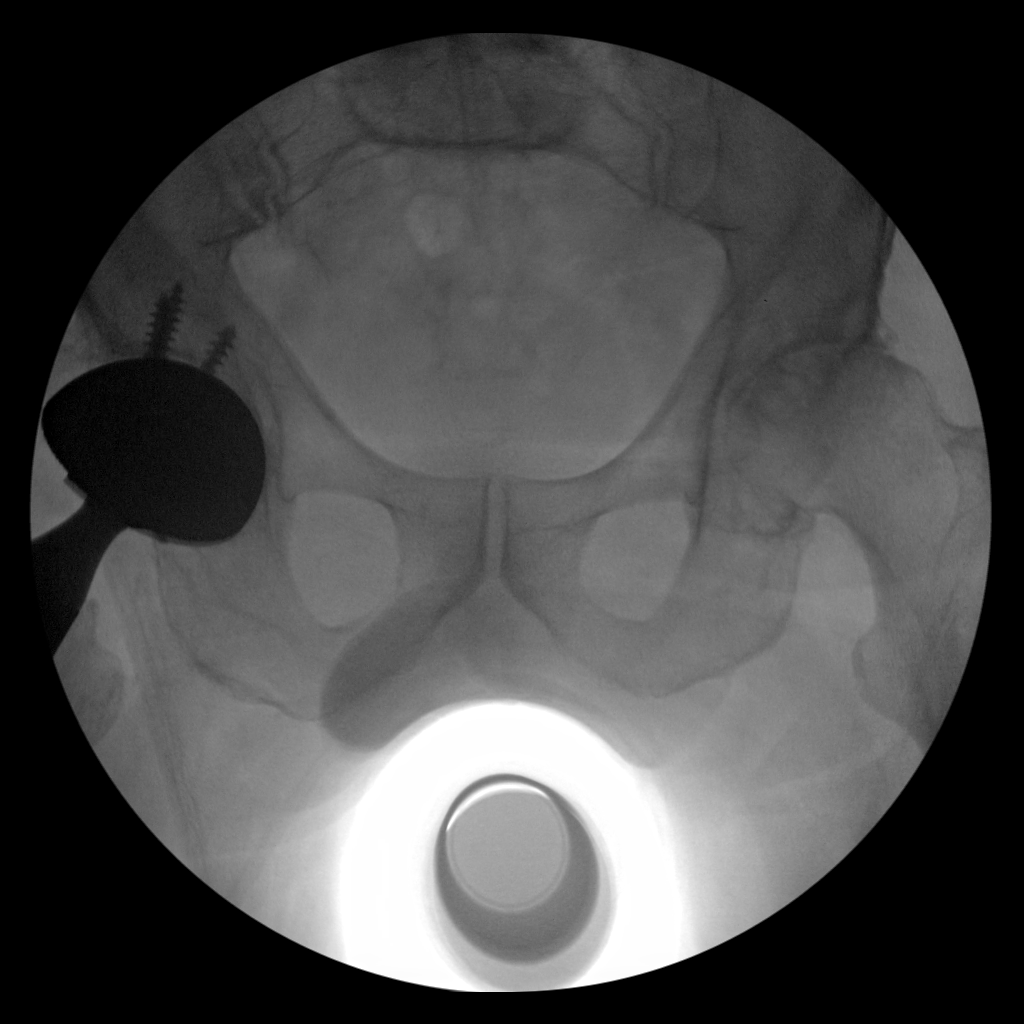
[im 3/4]
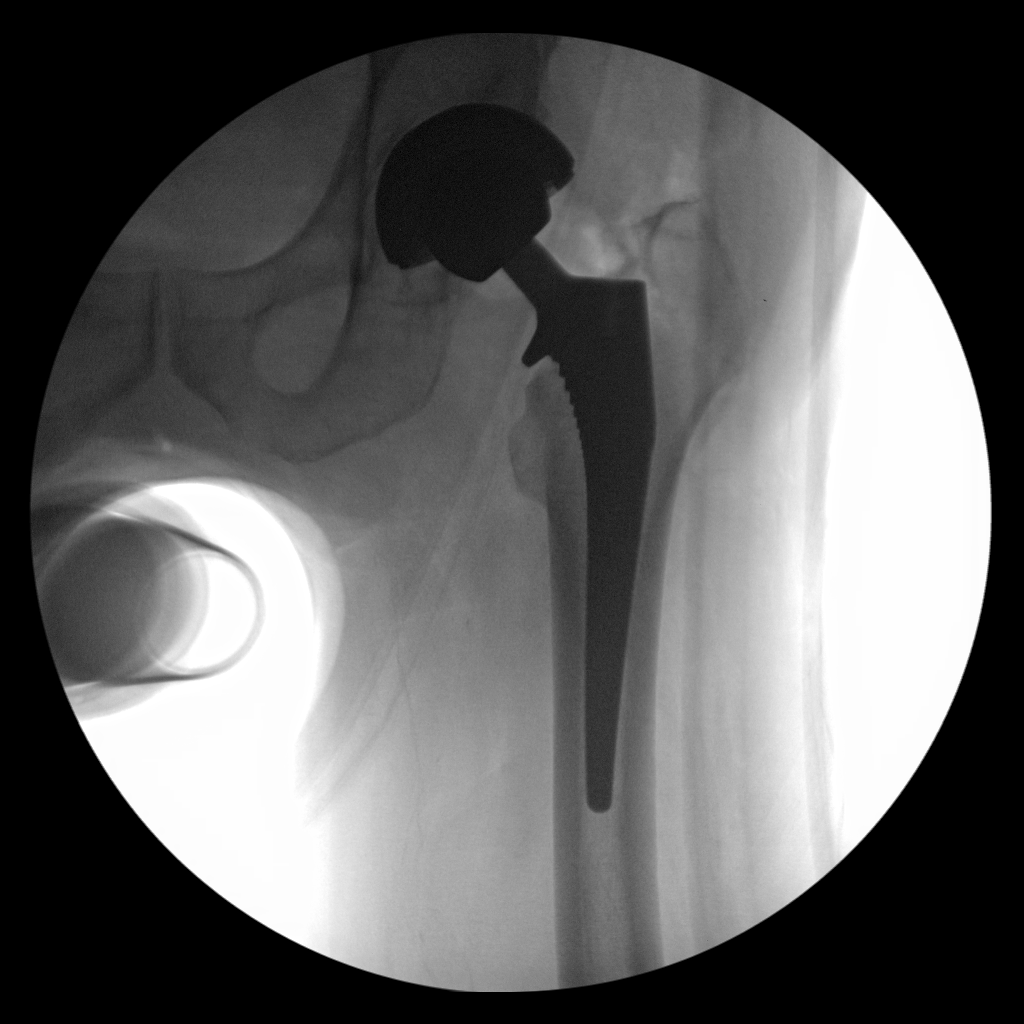
[im 4/4]
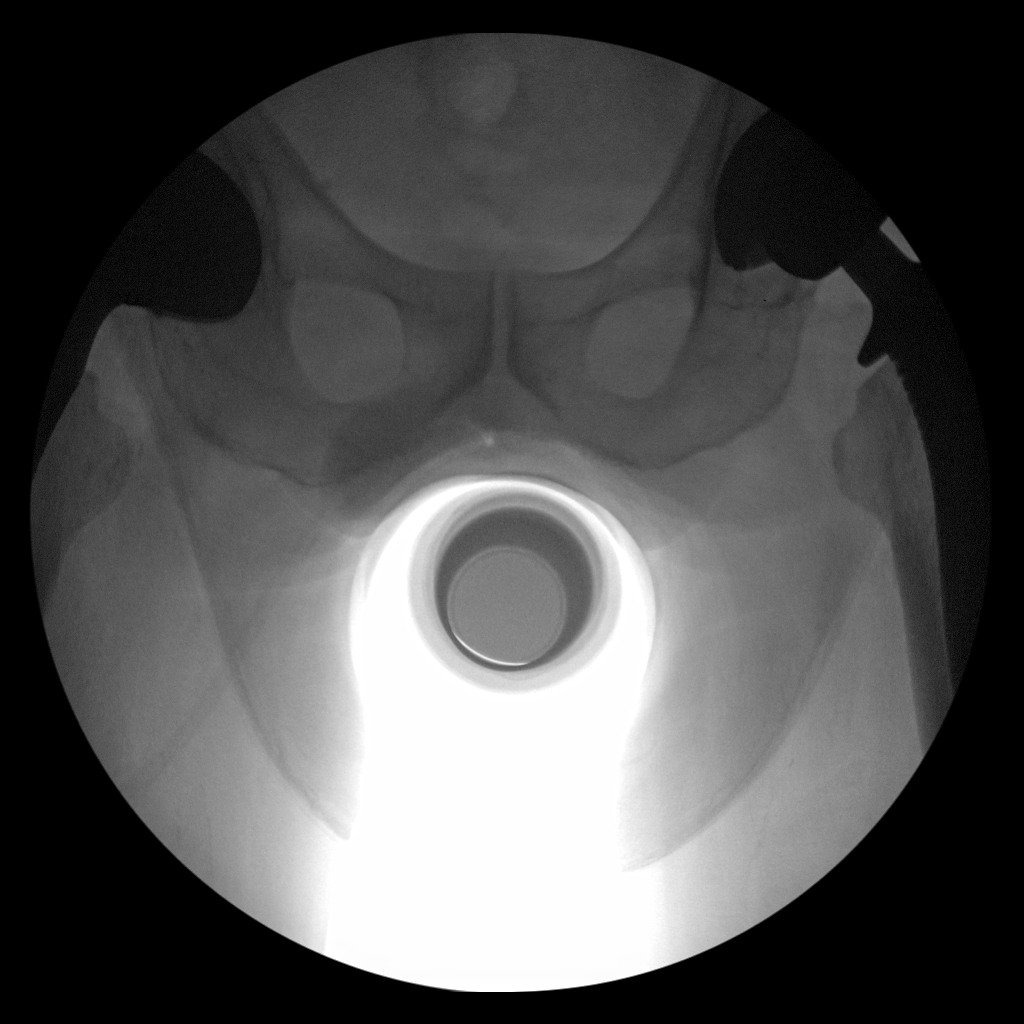

[4 of 4 positions shown; findings below may reference images not displayed]

FINDINGS: Four C-arm views demonstrate previously described left hip
degenerative changes with subsequent placement of a left hip
prosthesis. The prosthesis is in satisfactory position and alignment
with no visible fracture or dislocation on these views. Stable
previously demonstrated right hip prosthesis.
IMPRESSION: 1. Satisfactory postoperative appearance of a left hip prosthesis
with no visible hardware complication.
2. Stable right hip prosthesis.

## 2020-09-29 DIAGNOSIS — R21 Rash and other nonspecific skin eruption: Secondary | ICD-10-CM | POA: Diagnosis not present

## 2020-09-29 DIAGNOSIS — Z6830 Body mass index (BMI) 30.0-30.9, adult: Secondary | ICD-10-CM | POA: Diagnosis not present

## 2020-10-16 DIAGNOSIS — S0181XA Laceration without foreign body of other part of head, initial encounter: Secondary | ICD-10-CM | POA: Diagnosis not present

## 2020-10-25 DIAGNOSIS — S91309A Unspecified open wound, unspecified foot, initial encounter: Secondary | ICD-10-CM | POA: Diagnosis not present

## 2020-10-25 DIAGNOSIS — W19XXXD Unspecified fall, subsequent encounter: Secondary | ICD-10-CM | POA: Diagnosis not present

## 2020-10-25 DIAGNOSIS — Z6829 Body mass index (BMI) 29.0-29.9, adult: Secondary | ICD-10-CM | POA: Diagnosis not present

## 2020-10-25 DIAGNOSIS — Z4802 Encounter for removal of sutures: Secondary | ICD-10-CM | POA: Diagnosis not present

## 2020-10-28 DIAGNOSIS — J029 Acute pharyngitis, unspecified: Secondary | ICD-10-CM | POA: Diagnosis not present

## 2020-10-28 DIAGNOSIS — Z6829 Body mass index (BMI) 29.0-29.9, adult: Secondary | ICD-10-CM | POA: Diagnosis not present

## 2020-10-28 DIAGNOSIS — R1310 Dysphagia, unspecified: Secondary | ICD-10-CM | POA: Diagnosis not present

## 2020-11-11 DIAGNOSIS — R1310 Dysphagia, unspecified: Secondary | ICD-10-CM | POA: Diagnosis not present

## 2020-11-11 DIAGNOSIS — K219 Gastro-esophageal reflux disease without esophagitis: Secondary | ICD-10-CM | POA: Diagnosis not present

## 2020-11-11 DIAGNOSIS — Z6829 Body mass index (BMI) 29.0-29.9, adult: Secondary | ICD-10-CM | POA: Diagnosis not present

## 2020-12-22 DIAGNOSIS — Z1331 Encounter for screening for depression: Secondary | ICD-10-CM | POA: Diagnosis not present

## 2020-12-22 DIAGNOSIS — Z6829 Body mass index (BMI) 29.0-29.9, adult: Secondary | ICD-10-CM | POA: Diagnosis not present

## 2020-12-22 DIAGNOSIS — Z0000 Encounter for general adult medical examination without abnormal findings: Secondary | ICD-10-CM | POA: Diagnosis not present

## 2020-12-22 DIAGNOSIS — Z13220 Encounter for screening for lipoid disorders: Secondary | ICD-10-CM | POA: Diagnosis not present

## 2020-12-22 DIAGNOSIS — K219 Gastro-esophageal reflux disease without esophagitis: Secondary | ICD-10-CM | POA: Diagnosis not present

## 2021-03-24 DIAGNOSIS — K219 Gastro-esophageal reflux disease without esophagitis: Secondary | ICD-10-CM | POA: Diagnosis not present

## 2021-03-24 DIAGNOSIS — M25611 Stiffness of right shoulder, not elsewhere classified: Secondary | ICD-10-CM | POA: Diagnosis not present

## 2021-03-24 DIAGNOSIS — Z6829 Body mass index (BMI) 29.0-29.9, adult: Secondary | ICD-10-CM | POA: Diagnosis not present

## 2021-03-24 DIAGNOSIS — M25612 Stiffness of left shoulder, not elsewhere classified: Secondary | ICD-10-CM | POA: Diagnosis not present

## 2021-03-24 DIAGNOSIS — Z2821 Immunization not carried out because of patient refusal: Secondary | ICD-10-CM | POA: Diagnosis not present

## 2021-04-26 DIAGNOSIS — L989 Disorder of the skin and subcutaneous tissue, unspecified: Secondary | ICD-10-CM | POA: Diagnosis not present

## 2021-04-26 DIAGNOSIS — Z6829 Body mass index (BMI) 29.0-29.9, adult: Secondary | ICD-10-CM | POA: Diagnosis not present

## 2021-05-17 DIAGNOSIS — L989 Disorder of the skin and subcutaneous tissue, unspecified: Secondary | ICD-10-CM | POA: Diagnosis not present

## 2021-05-17 DIAGNOSIS — Z6829 Body mass index (BMI) 29.0-29.9, adult: Secondary | ICD-10-CM | POA: Diagnosis not present

## 2021-05-24 DIAGNOSIS — C44319 Basal cell carcinoma of skin of other parts of face: Secondary | ICD-10-CM | POA: Diagnosis not present

## 2021-05-24 DIAGNOSIS — L570 Actinic keratosis: Secondary | ICD-10-CM | POA: Diagnosis not present

## 2021-05-24 DIAGNOSIS — L814 Other melanin hyperpigmentation: Secondary | ICD-10-CM | POA: Diagnosis not present

## 2021-05-24 DIAGNOSIS — L578 Other skin changes due to chronic exposure to nonionizing radiation: Secondary | ICD-10-CM | POA: Diagnosis not present

## 2021-06-22 DIAGNOSIS — C44329 Squamous cell carcinoma of skin of other parts of face: Secondary | ICD-10-CM | POA: Diagnosis not present

## 2021-07-04 DIAGNOSIS — Z6829 Body mass index (BMI) 29.0-29.9, adult: Secondary | ICD-10-CM | POA: Diagnosis not present

## 2021-07-04 DIAGNOSIS — T148XXA Other injury of unspecified body region, initial encounter: Secondary | ICD-10-CM | POA: Diagnosis not present

## 2021-08-01 DIAGNOSIS — L820 Inflamed seborrheic keratosis: Secondary | ICD-10-CM | POA: Diagnosis not present

## 2021-08-01 DIAGNOSIS — L570 Actinic keratosis: Secondary | ICD-10-CM | POA: Diagnosis not present

## 2021-09-18 DIAGNOSIS — J329 Chronic sinusitis, unspecified: Secondary | ICD-10-CM | POA: Diagnosis not present

## 2021-09-18 DIAGNOSIS — J40 Bronchitis, not specified as acute or chronic: Secondary | ICD-10-CM | POA: Diagnosis not present

## 2021-09-18 DIAGNOSIS — Z6829 Body mass index (BMI) 29.0-29.9, adult: Secondary | ICD-10-CM | POA: Diagnosis not present

## 2021-10-02 DIAGNOSIS — J329 Chronic sinusitis, unspecified: Secondary | ICD-10-CM | POA: Diagnosis not present

## 2021-10-02 DIAGNOSIS — J40 Bronchitis, not specified as acute or chronic: Secondary | ICD-10-CM | POA: Diagnosis not present

## 2021-10-02 DIAGNOSIS — Z6828 Body mass index (BMI) 28.0-28.9, adult: Secondary | ICD-10-CM | POA: Diagnosis not present

## 2021-11-23 DIAGNOSIS — H04123 Dry eye syndrome of bilateral lacrimal glands: Secondary | ICD-10-CM | POA: Diagnosis not present

## 2021-11-23 DIAGNOSIS — H52203 Unspecified astigmatism, bilateral: Secondary | ICD-10-CM | POA: Diagnosis not present

## 2021-11-23 DIAGNOSIS — H524 Presbyopia: Secondary | ICD-10-CM | POA: Diagnosis not present

## 2021-11-23 DIAGNOSIS — H5203 Hypermetropia, bilateral: Secondary | ICD-10-CM | POA: Diagnosis not present

## 2021-11-23 DIAGNOSIS — H43393 Other vitreous opacities, bilateral: Secondary | ICD-10-CM | POA: Diagnosis not present

## 2021-11-23 DIAGNOSIS — H02401 Unspecified ptosis of right eyelid: Secondary | ICD-10-CM | POA: Diagnosis not present

## 2021-11-23 DIAGNOSIS — H43813 Vitreous degeneration, bilateral: Secondary | ICD-10-CM | POA: Diagnosis not present

## 2021-12-26 DIAGNOSIS — Z79899 Other long term (current) drug therapy: Secondary | ICD-10-CM | POA: Diagnosis not present

## 2021-12-26 DIAGNOSIS — R7302 Impaired glucose tolerance (oral): Secondary | ICD-10-CM | POA: Diagnosis not present

## 2021-12-26 DIAGNOSIS — Z1331 Encounter for screening for depression: Secondary | ICD-10-CM | POA: Diagnosis not present

## 2021-12-26 DIAGNOSIS — Z6828 Body mass index (BMI) 28.0-28.9, adult: Secondary | ICD-10-CM | POA: Diagnosis not present

## 2021-12-26 DIAGNOSIS — D692 Other nonthrombocytopenic purpura: Secondary | ICD-10-CM | POA: Diagnosis not present

## 2021-12-26 DIAGNOSIS — K219 Gastro-esophageal reflux disease without esophagitis: Secondary | ICD-10-CM | POA: Diagnosis not present

## 2021-12-26 DIAGNOSIS — Z0000 Encounter for general adult medical examination without abnormal findings: Secondary | ICD-10-CM | POA: Diagnosis not present

## 2021-12-26 DIAGNOSIS — Z789 Other specified health status: Secondary | ICD-10-CM | POA: Diagnosis not present

## 2021-12-26 DIAGNOSIS — Z13220 Encounter for screening for lipoid disorders: Secondary | ICD-10-CM | POA: Diagnosis not present

## 2022-01-29 DIAGNOSIS — G8929 Other chronic pain: Secondary | ICD-10-CM | POA: Diagnosis not present

## 2022-01-29 DIAGNOSIS — Z8546 Personal history of malignant neoplasm of prostate: Secondary | ICD-10-CM | POA: Diagnosis not present

## 2022-01-29 DIAGNOSIS — R32 Unspecified urinary incontinence: Secondary | ICD-10-CM | POA: Diagnosis not present

## 2022-01-29 DIAGNOSIS — E669 Obesity, unspecified: Secondary | ICD-10-CM | POA: Diagnosis not present

## 2022-01-29 DIAGNOSIS — K219 Gastro-esophageal reflux disease without esophagitis: Secondary | ICD-10-CM | POA: Diagnosis not present

## 2022-03-14 DIAGNOSIS — C44622 Squamous cell carcinoma of skin of right upper limb, including shoulder: Secondary | ICD-10-CM | POA: Diagnosis not present

## 2022-03-14 DIAGNOSIS — B078 Other viral warts: Secondary | ICD-10-CM | POA: Diagnosis not present

## 2022-03-14 DIAGNOSIS — Z6829 Body mass index (BMI) 29.0-29.9, adult: Secondary | ICD-10-CM | POA: Diagnosis not present

## 2022-04-23 DIAGNOSIS — C44622 Squamous cell carcinoma of skin of right upper limb, including shoulder: Secondary | ICD-10-CM | POA: Diagnosis not present

## 2022-04-23 DIAGNOSIS — L578 Other skin changes due to chronic exposure to nonionizing radiation: Secondary | ICD-10-CM | POA: Diagnosis not present

## 2022-04-23 DIAGNOSIS — L821 Other seborrheic keratosis: Secondary | ICD-10-CM | POA: Diagnosis not present

## 2022-04-23 DIAGNOSIS — D485 Neoplasm of uncertain behavior of skin: Secondary | ICD-10-CM | POA: Diagnosis not present

## 2022-04-23 DIAGNOSIS — D0439 Carcinoma in situ of skin of other parts of face: Secondary | ICD-10-CM | POA: Diagnosis not present

## 2022-04-23 DIAGNOSIS — L570 Actinic keratosis: Secondary | ICD-10-CM | POA: Diagnosis not present

## 2022-07-30 DIAGNOSIS — W19XXXA Unspecified fall, initial encounter: Secondary | ICD-10-CM | POA: Diagnosis not present

## 2022-07-30 DIAGNOSIS — S0181XA Laceration without foreign body of other part of head, initial encounter: Secondary | ICD-10-CM | POA: Diagnosis not present

## 2022-08-01 DIAGNOSIS — S0181XA Laceration without foreign body of other part of head, initial encounter: Secondary | ICD-10-CM | POA: Diagnosis not present

## 2022-08-01 DIAGNOSIS — Z043 Encounter for examination and observation following other accident: Secondary | ICD-10-CM | POA: Diagnosis not present

## 2022-08-01 DIAGNOSIS — M25561 Pain in right knee: Secondary | ICD-10-CM | POA: Diagnosis not present

## 2022-08-01 DIAGNOSIS — S80211A Abrasion, right knee, initial encounter: Secondary | ICD-10-CM | POA: Diagnosis not present

## 2022-08-01 DIAGNOSIS — W19XXXA Unspecified fall, initial encounter: Secondary | ICD-10-CM | POA: Diagnosis not present

## 2022-08-01 DIAGNOSIS — R58 Hemorrhage, not elsewhere classified: Secondary | ICD-10-CM | POA: Diagnosis not present

## 2022-08-09 DIAGNOSIS — Z6828 Body mass index (BMI) 28.0-28.9, adult: Secondary | ICD-10-CM | POA: Diagnosis not present

## 2022-08-09 DIAGNOSIS — S0181XS Laceration without foreign body of other part of head, sequela: Secondary | ICD-10-CM | POA: Diagnosis not present

## 2022-08-09 DIAGNOSIS — W19XXXS Unspecified fall, sequela: Secondary | ICD-10-CM | POA: Diagnosis not present

## 2022-08-15 DIAGNOSIS — W19XXXS Unspecified fall, sequela: Secondary | ICD-10-CM | POA: Diagnosis not present

## 2022-08-15 DIAGNOSIS — S80211S Abrasion, right knee, sequela: Secondary | ICD-10-CM | POA: Diagnosis not present

## 2022-11-16 DIAGNOSIS — M19042 Primary osteoarthritis, left hand: Secondary | ICD-10-CM | POA: Diagnosis not present

## 2022-11-16 DIAGNOSIS — L989 Disorder of the skin and subcutaneous tissue, unspecified: Secondary | ICD-10-CM | POA: Diagnosis not present

## 2022-11-16 DIAGNOSIS — M19041 Primary osteoarthritis, right hand: Secondary | ICD-10-CM | POA: Diagnosis not present

## 2022-11-16 DIAGNOSIS — Z6828 Body mass index (BMI) 28.0-28.9, adult: Secondary | ICD-10-CM | POA: Diagnosis not present

## 2022-11-16 DIAGNOSIS — D692 Other nonthrombocytopenic purpura: Secondary | ICD-10-CM | POA: Diagnosis not present

## 2022-12-05 DIAGNOSIS — K219 Gastro-esophageal reflux disease without esophagitis: Secondary | ICD-10-CM | POA: Diagnosis not present

## 2022-12-05 DIAGNOSIS — E663 Overweight: Secondary | ICD-10-CM | POA: Diagnosis not present

## 2022-12-05 DIAGNOSIS — E669 Obesity, unspecified: Secondary | ICD-10-CM | POA: Diagnosis not present

## 2022-12-05 DIAGNOSIS — G8929 Other chronic pain: Secondary | ICD-10-CM | POA: Diagnosis not present

## 2022-12-05 DIAGNOSIS — Z9849 Cataract extraction status, unspecified eye: Secondary | ICD-10-CM | POA: Diagnosis not present

## 2022-12-31 DIAGNOSIS — K219 Gastro-esophageal reflux disease without esophagitis: Secondary | ICD-10-CM | POA: Diagnosis not present

## 2022-12-31 DIAGNOSIS — Z79899 Other long term (current) drug therapy: Secondary | ICD-10-CM | POA: Diagnosis not present

## 2022-12-31 DIAGNOSIS — Z0000 Encounter for general adult medical examination without abnormal findings: Secondary | ICD-10-CM | POA: Diagnosis not present

## 2022-12-31 DIAGNOSIS — M25511 Pain in right shoulder: Secondary | ICD-10-CM | POA: Diagnosis not present

## 2022-12-31 DIAGNOSIS — Z6828 Body mass index (BMI) 28.0-28.9, adult: Secondary | ICD-10-CM | POA: Diagnosis not present

## 2022-12-31 DIAGNOSIS — Z13220 Encounter for screening for lipoid disorders: Secondary | ICD-10-CM | POA: Diagnosis not present

## 2022-12-31 DIAGNOSIS — R7302 Impaired glucose tolerance (oral): Secondary | ICD-10-CM | POA: Diagnosis not present

## 2022-12-31 DIAGNOSIS — Z1339 Encounter for screening examination for other mental health and behavioral disorders: Secondary | ICD-10-CM | POA: Diagnosis not present

## 2022-12-31 DIAGNOSIS — Z1331 Encounter for screening for depression: Secondary | ICD-10-CM | POA: Diagnosis not present

## 2023-01-01 DIAGNOSIS — S43439A Superior glenoid labrum lesion of unspecified shoulder, initial encounter: Secondary | ICD-10-CM | POA: Diagnosis not present

## 2023-01-01 DIAGNOSIS — M19011 Primary osteoarthritis, right shoulder: Secondary | ICD-10-CM | POA: Diagnosis not present

## 2023-01-01 DIAGNOSIS — M25411 Effusion, right shoulder: Secondary | ICD-10-CM | POA: Diagnosis not present

## 2023-01-01 DIAGNOSIS — M25511 Pain in right shoulder: Secondary | ICD-10-CM | POA: Diagnosis not present

## 2023-01-01 DIAGNOSIS — Z6828 Body mass index (BMI) 28.0-28.9, adult: Secondary | ICD-10-CM | POA: Diagnosis not present

## 2023-01-31 DIAGNOSIS — Z6828 Body mass index (BMI) 28.0-28.9, adult: Secondary | ICD-10-CM | POA: Diagnosis not present

## 2023-01-31 DIAGNOSIS — M25411 Effusion, right shoulder: Secondary | ICD-10-CM | POA: Diagnosis not present

## 2023-03-14 DIAGNOSIS — H5203 Hypermetropia, bilateral: Secondary | ICD-10-CM | POA: Diagnosis not present

## 2023-03-14 DIAGNOSIS — Z961 Presence of intraocular lens: Secondary | ICD-10-CM | POA: Diagnosis not present

## 2023-03-14 DIAGNOSIS — H524 Presbyopia: Secondary | ICD-10-CM | POA: Diagnosis not present

## 2023-03-14 DIAGNOSIS — H52203 Unspecified astigmatism, bilateral: Secondary | ICD-10-CM | POA: Diagnosis not present

## 2023-03-14 DIAGNOSIS — H43813 Vitreous degeneration, bilateral: Secondary | ICD-10-CM | POA: Diagnosis not present

## 2023-03-14 DIAGNOSIS — H43393 Other vitreous opacities, bilateral: Secondary | ICD-10-CM | POA: Diagnosis not present

## 2023-03-14 DIAGNOSIS — H18313 Folds and rupture in Bowman's membrane, bilateral: Secondary | ICD-10-CM | POA: Diagnosis not present

## 2023-05-23 DIAGNOSIS — D485 Neoplasm of uncertain behavior of skin: Secondary | ICD-10-CM | POA: Diagnosis not present

## 2023-06-06 DIAGNOSIS — C44529 Squamous cell carcinoma of skin of other part of trunk: Secondary | ICD-10-CM | POA: Diagnosis not present

## 2023-06-19 DIAGNOSIS — J069 Acute upper respiratory infection, unspecified: Secondary | ICD-10-CM | POA: Diagnosis not present

## 2023-06-19 DIAGNOSIS — Z6829 Body mass index (BMI) 29.0-29.9, adult: Secondary | ICD-10-CM | POA: Diagnosis not present

## 2023-10-22 DIAGNOSIS — R300 Dysuria: Secondary | ICD-10-CM | POA: Diagnosis not present

## 2023-10-22 DIAGNOSIS — K5900 Constipation, unspecified: Secondary | ICD-10-CM | POA: Diagnosis not present

## 2023-10-22 DIAGNOSIS — K8020 Calculus of gallbladder without cholecystitis without obstruction: Secondary | ICD-10-CM | POA: Diagnosis not present

## 2023-10-22 DIAGNOSIS — R32 Unspecified urinary incontinence: Secondary | ICD-10-CM | POA: Diagnosis not present

## 2023-10-22 DIAGNOSIS — Z6828 Body mass index (BMI) 28.0-28.9, adult: Secondary | ICD-10-CM | POA: Diagnosis not present

## 2023-10-22 DIAGNOSIS — R197 Diarrhea, unspecified: Secondary | ICD-10-CM | POA: Diagnosis not present

## 2023-10-25 DIAGNOSIS — K8020 Calculus of gallbladder without cholecystitis without obstruction: Secondary | ICD-10-CM | POA: Diagnosis not present

## 2023-10-30 DIAGNOSIS — R918 Other nonspecific abnormal finding of lung field: Secondary | ICD-10-CM | POA: Diagnosis not present

## 2023-10-30 DIAGNOSIS — K8020 Calculus of gallbladder without cholecystitis without obstruction: Secondary | ICD-10-CM | POA: Diagnosis not present

## 2023-10-30 DIAGNOSIS — K449 Diaphragmatic hernia without obstruction or gangrene: Secondary | ICD-10-CM | POA: Diagnosis not present

## 2023-10-30 DIAGNOSIS — I517 Cardiomegaly: Secondary | ICD-10-CM | POA: Diagnosis not present

## 2023-10-30 DIAGNOSIS — R1013 Epigastric pain: Secondary | ICD-10-CM | POA: Diagnosis not present

## 2023-10-30 DIAGNOSIS — K5730 Diverticulosis of large intestine without perforation or abscess without bleeding: Secondary | ICD-10-CM | POA: Diagnosis not present

## 2023-11-01 DIAGNOSIS — K449 Diaphragmatic hernia without obstruction or gangrene: Secondary | ICD-10-CM | POA: Diagnosis not present

## 2023-11-01 DIAGNOSIS — R110 Nausea: Secondary | ICD-10-CM | POA: Diagnosis not present

## 2023-11-01 DIAGNOSIS — Z6826 Body mass index (BMI) 26.0-26.9, adult: Secondary | ICD-10-CM | POA: Diagnosis not present

## 2023-11-01 DIAGNOSIS — J189 Pneumonia, unspecified organism: Secondary | ICD-10-CM | POA: Diagnosis not present

## 2023-11-01 DIAGNOSIS — R1013 Epigastric pain: Secondary | ICD-10-CM | POA: Diagnosis not present

## 2023-11-01 DIAGNOSIS — K219 Gastro-esophageal reflux disease without esophagitis: Secondary | ICD-10-CM | POA: Diagnosis not present

## 2023-11-05 DIAGNOSIS — K5900 Constipation, unspecified: Secondary | ICD-10-CM | POA: Diagnosis not present

## 2023-11-05 DIAGNOSIS — R1110 Vomiting, unspecified: Secondary | ICD-10-CM | POA: Diagnosis not present

## 2023-11-05 DIAGNOSIS — R634 Abnormal weight loss: Secondary | ICD-10-CM | POA: Diagnosis not present

## 2023-11-05 DIAGNOSIS — K219 Gastro-esophageal reflux disease without esophagitis: Secondary | ICD-10-CM | POA: Diagnosis not present

## 2023-11-05 DIAGNOSIS — K449 Diaphragmatic hernia without obstruction or gangrene: Secondary | ICD-10-CM | POA: Diagnosis not present

## 2023-11-13 DIAGNOSIS — K219 Gastro-esophageal reflux disease without esophagitis: Secondary | ICD-10-CM | POA: Diagnosis not present

## 2023-11-13 DIAGNOSIS — K449 Diaphragmatic hernia without obstruction or gangrene: Secondary | ICD-10-CM | POA: Diagnosis not present

## 2023-12-12 DIAGNOSIS — K219 Gastro-esophageal reflux disease without esophagitis: Secondary | ICD-10-CM | POA: Diagnosis not present

## 2023-12-12 DIAGNOSIS — R1110 Vomiting, unspecified: Secondary | ICD-10-CM | POA: Diagnosis not present

## 2023-12-12 DIAGNOSIS — K449 Diaphragmatic hernia without obstruction or gangrene: Secondary | ICD-10-CM | POA: Diagnosis not present

## 2023-12-12 DIAGNOSIS — R634 Abnormal weight loss: Secondary | ICD-10-CM | POA: Diagnosis not present

## 2023-12-12 DIAGNOSIS — K5900 Constipation, unspecified: Secondary | ICD-10-CM | POA: Diagnosis not present

## 2024-03-18 DIAGNOSIS — H18313 Folds and rupture in Bowman's membrane, bilateral: Secondary | ICD-10-CM | POA: Diagnosis not present

## 2024-03-18 DIAGNOSIS — H52203 Unspecified astigmatism, bilateral: Secondary | ICD-10-CM | POA: Diagnosis not present

## 2024-03-18 DIAGNOSIS — H43393 Other vitreous opacities, bilateral: Secondary | ICD-10-CM | POA: Diagnosis not present

## 2024-03-18 DIAGNOSIS — H524 Presbyopia: Secondary | ICD-10-CM | POA: Diagnosis not present

## 2024-03-18 DIAGNOSIS — H43813 Vitreous degeneration, bilateral: Secondary | ICD-10-CM | POA: Diagnosis not present

## 2024-03-18 DIAGNOSIS — Z961 Presence of intraocular lens: Secondary | ICD-10-CM | POA: Diagnosis not present

## 2024-03-18 DIAGNOSIS — H5203 Hypermetropia, bilateral: Secondary | ICD-10-CM | POA: Diagnosis not present

## 2024-03-24 DIAGNOSIS — L820 Inflamed seborrheic keratosis: Secondary | ICD-10-CM | POA: Diagnosis not present

## 2024-03-24 DIAGNOSIS — D2239 Melanocytic nevi of other parts of face: Secondary | ICD-10-CM | POA: Diagnosis not present

## 2024-03-24 DIAGNOSIS — L570 Actinic keratosis: Secondary | ICD-10-CM | POA: Diagnosis not present

## 2024-03-24 DIAGNOSIS — L821 Other seborrheic keratosis: Secondary | ICD-10-CM | POA: Diagnosis not present

## 2024-03-24 DIAGNOSIS — D225 Melanocytic nevi of trunk: Secondary | ICD-10-CM | POA: Diagnosis not present

## 2024-04-21 DIAGNOSIS — L819 Disorder of pigmentation, unspecified: Secondary | ICD-10-CM | POA: Diagnosis not present

## 2024-04-21 DIAGNOSIS — Z6828 Body mass index (BMI) 28.0-28.9, adult: Secondary | ICD-10-CM | POA: Diagnosis not present

## 2024-04-21 DIAGNOSIS — L0390 Cellulitis, unspecified: Secondary | ICD-10-CM | POA: Diagnosis not present

## 2024-04-24 DIAGNOSIS — D485 Neoplasm of uncertain behavior of skin: Secondary | ICD-10-CM | POA: Diagnosis not present

## 2024-04-24 DIAGNOSIS — L570 Actinic keratosis: Secondary | ICD-10-CM | POA: Diagnosis not present

## 2024-04-24 DIAGNOSIS — D2239 Melanocytic nevi of other parts of face: Secondary | ICD-10-CM | POA: Diagnosis not present

## 2024-04-24 DIAGNOSIS — L820 Inflamed seborrheic keratosis: Secondary | ICD-10-CM | POA: Diagnosis not present

## 2024-06-22 ENCOUNTER — Other Ambulatory Visit: Payer: Self-pay | Admitting: Internal Medicine
